# Patient Record
Sex: Male | Born: 1950 | Race: White | Hispanic: No | Marital: Married | Smoking: Former smoker
Health system: Southern US, Community
[De-identification: ages and names within clinical notes are randomized; demographics above are authoritative.]

## PROBLEM LIST (undated history)

## (undated) DIAGNOSIS — F32A Depression, unspecified: Secondary | ICD-10-CM

## (undated) DIAGNOSIS — F329 Major depressive disorder, single episode, unspecified: Secondary | ICD-10-CM

## (undated) DIAGNOSIS — J449 Chronic obstructive pulmonary disease, unspecified: Secondary | ICD-10-CM

## (undated) DIAGNOSIS — E039 Hypothyroidism, unspecified: Secondary | ICD-10-CM

## (undated) DIAGNOSIS — E785 Hyperlipidemia, unspecified: Secondary | ICD-10-CM

## (undated) DIAGNOSIS — F0390 Unspecified dementia without behavioral disturbance: Secondary | ICD-10-CM

## (undated) DIAGNOSIS — F419 Anxiety disorder, unspecified: Secondary | ICD-10-CM

## (undated) DIAGNOSIS — E119 Type 2 diabetes mellitus without complications: Secondary | ICD-10-CM

## (undated) DIAGNOSIS — E7800 Pure hypercholesterolemia, unspecified: Secondary | ICD-10-CM

## (undated) DIAGNOSIS — F319 Bipolar disorder, unspecified: Secondary | ICD-10-CM

## (undated) DIAGNOSIS — I4891 Unspecified atrial fibrillation: Secondary | ICD-10-CM

## (undated) HISTORY — PX: APPENDECTOMY: SHX54

---

## 2007-10-24 ENCOUNTER — Other Ambulatory Visit: Payer: Self-pay | Admitting: Emergency Medicine

## 2007-10-24 ENCOUNTER — Ambulatory Visit: Payer: Self-pay | Admitting: Psychiatry

## 2007-10-24 ENCOUNTER — Inpatient Hospital Stay (HOSPITAL_COMMUNITY): Admission: AD | Admit: 2007-10-24 | Discharge: 2007-10-31 | Payer: Self-pay | Admitting: Psychiatry

## 2010-02-27 ENCOUNTER — Emergency Department (HOSPITAL_COMMUNITY): Admission: EM | Admit: 2010-02-27 | Discharge: 2010-02-27 | Payer: Self-pay | Admitting: Emergency Medicine

## 2010-07-06 ENCOUNTER — Emergency Department (HOSPITAL_COMMUNITY)
Admission: EM | Admit: 2010-07-06 | Discharge: 2010-07-07 | Disposition: A | Discharge status: Other Acute Inpatient Hospital | Payer: Self-pay | Source: Home / Self Care | Admitting: Emergency Medicine

## 2010-07-06 LAB — HEPATIC FUNCTION PANEL
ALT: 56 U/L — ABNORMAL HIGH (ref 0–53)
AST: 42 U/L — ABNORMAL HIGH (ref 0–37)
Albumin: 4.1 g/dL (ref 3.5–5.2)
Alkaline Phosphatase: 72 U/L (ref 39–117)
Bilirubin, Direct: 0.3 mg/dL (ref 0.0–0.3)
Indirect Bilirubin: 1.1 mg/dL — ABNORMAL HIGH (ref 0.3–0.9)
Total Bilirubin: 1.4 mg/dL — ABNORMAL HIGH (ref 0.3–1.2)
Total Protein: 7.3 g/dL (ref 6.0–8.3)

## 2010-07-06 LAB — RAPID URINE DRUG SCREEN, HOSP PERFORMED
Amphetamines: NOT DETECTED
Barbiturates: NOT DETECTED
Benzodiazepines: POSITIVE — AB
Cocaine: NOT DETECTED
Opiates: NOT DETECTED
Tetrahydrocannabinol: NOT DETECTED

## 2010-07-06 LAB — DIFFERENTIAL
Basophils Absolute: 0 10*3/uL (ref 0.0–0.1)
Basophils Relative: 0 % (ref 0–1)
Eosinophils Absolute: 0 10*3/uL (ref 0.0–0.7)
Eosinophils Relative: 0 % (ref 0–5)
Lymphocytes Relative: 18 % (ref 12–46)
Lymphs Abs: 1.9 10*3/uL (ref 0.7–4.0)
Monocytes Absolute: 0.7 10*3/uL (ref 0.1–1.0)
Monocytes Relative: 7 % (ref 3–12)
Neutro Abs: 7.6 10*3/uL (ref 1.7–7.7)
Neutrophils Relative %: 74 % (ref 43–77)

## 2010-07-06 LAB — CBC
HCT: 47.4 % (ref 39.0–52.0)
Hemoglobin: 16.3 g/dL (ref 13.0–17.0)
MCH: 30.9 pg (ref 26.0–34.0)
MCHC: 34.4 g/dL (ref 30.0–36.0)
MCV: 89.8 fL (ref 78.0–100.0)
Platelets: 196 10*3/uL (ref 150–400)
RBC: 5.28 MIL/uL (ref 4.22–5.81)
RDW: 14.6 % (ref 11.5–15.5)
WBC: 10.3 10*3/uL (ref 4.0–10.5)

## 2010-07-06 LAB — GLUCOSE, CAPILLARY
Glucose-Capillary: 136 mg/dL — ABNORMAL HIGH (ref 70–99)
Glucose-Capillary: 140 mg/dL — ABNORMAL HIGH (ref 70–99)

## 2010-07-06 LAB — ACETAMINOPHEN LEVEL: Acetaminophen (Tylenol), Serum: <10 ug/mL — ABNORMAL LOW (ref 10–30)

## 2010-07-06 LAB — BASIC METABOLIC PANEL
BUN: 23 mg/dL (ref 6–23)
CO2: 20 mEq/L (ref 19–32)
Calcium: 9.5 mg/dL (ref 8.4–10.5)
Chloride: 106 mEq/L (ref 96–112)
Creatinine, Ser: 0.86 mg/dL (ref 0.4–1.5)
GFR calc Af Amer: >60 mL/min (ref >60)
GFR calc non Af Amer: >60 mL/min (ref >60)
Glucose, Bld: 186 mg/dL — ABNORMAL HIGH (ref 70–99)
Potassium: 4 mEq/L (ref 3.5–5.1)
Sodium: 137 mEq/L (ref 135–145)

## 2010-07-06 LAB — SALICYLATE LEVEL: Salicylate Lvl: <4 mg/dL (ref 2.8–20.0)

## 2010-07-06 LAB — ETHANOL: Alcohol, Ethyl (B): <5 mg/dL (ref 0–10)

## 2010-07-07 ENCOUNTER — Inpatient Hospital Stay (HOSPITAL_COMMUNITY)
Admission: AD | Admit: 2010-07-07 | Discharge: 2010-07-19 | Payer: Self-pay | Source: Home / Self Care | Attending: Psychiatry | Admitting: Psychiatry

## 2010-07-07 DIAGNOSIS — F331 Major depressive disorder, recurrent, moderate: Secondary | ICD-10-CM

## 2010-07-07 LAB — GLUCOSE, CAPILLARY
Glucose-Capillary: 122 mg/dL — ABNORMAL HIGH (ref 70–99)
Glucose-Capillary: 77 mg/dL (ref 70–99)

## 2010-07-17 LAB — GLUCOSE, CAPILLARY
Glucose-Capillary: 126 mg/dL — ABNORMAL HIGH (ref 70–99)
Glucose-Capillary: 129 mg/dL — ABNORMAL HIGH (ref 70–99)
Glucose-Capillary: 153 mg/dL — ABNORMAL HIGH (ref 70–99)
Glucose-Capillary: 104 mg/dL — ABNORMAL HIGH (ref 70–99)
Glucose-Capillary: 118 mg/dL — ABNORMAL HIGH (ref 70–99)
Glucose-Capillary: 104 mg/dL — ABNORMAL HIGH (ref 70–99)
Glucose-Capillary: 131 mg/dL — ABNORMAL HIGH (ref 70–99)
Glucose-Capillary: 138 mg/dL — ABNORMAL HIGH (ref 70–99)
Glucose-Capillary: 121 mg/dL — ABNORMAL HIGH (ref 70–99)
Glucose-Capillary: 143 mg/dL — ABNORMAL HIGH (ref 70–99)
Glucose-Capillary: 104 mg/dL — ABNORMAL HIGH (ref 70–99)
Glucose-Capillary: 110 mg/dL — ABNORMAL HIGH (ref 70–99)
Glucose-Capillary: 134 mg/dL — ABNORMAL HIGH (ref 70–99)
Glucose-Capillary: 192 mg/dL — ABNORMAL HIGH (ref 70–99)
Glucose-Capillary: 111 mg/dL — ABNORMAL HIGH (ref 70–99)
Glucose-Capillary: 135 mg/dL — ABNORMAL HIGH (ref 70–99)
Glucose-Capillary: 120 mg/dL — ABNORMAL HIGH (ref 70–99)
Glucose-Capillary: 117 mg/dL — ABNORMAL HIGH (ref 70–99)
Glucose-Capillary: 111 mg/dL — ABNORMAL HIGH (ref 70–99)
Glucose-Capillary: 105 mg/dL — ABNORMAL HIGH (ref 70–99)
Glucose-Capillary: 69 mg/dL — ABNORMAL LOW (ref 70–99)
Glucose-Capillary: 104 mg/dL — ABNORMAL HIGH (ref 70–99)
Glucose-Capillary: 112 mg/dL — ABNORMAL HIGH (ref 70–99)
Glucose-Capillary: 109 mg/dL — ABNORMAL HIGH (ref 70–99)
Glucose-Capillary: 112 mg/dL — ABNORMAL HIGH (ref 70–99)
Glucose-Capillary: 118 mg/dL — ABNORMAL HIGH (ref 70–99)
Glucose-Capillary: 126 mg/dL — ABNORMAL HIGH (ref 70–99)

## 2010-07-19 LAB — GLUCOSE, CAPILLARY
Glucose-Capillary: 113 mg/dL — ABNORMAL HIGH (ref 70–99)
Glucose-Capillary: 100 mg/dL — ABNORMAL HIGH (ref 70–99)
Glucose-Capillary: 122 mg/dL — ABNORMAL HIGH (ref 70–99)
Glucose-Capillary: 94 mg/dL (ref 70–99)
Glucose-Capillary: 115 mg/dL — ABNORMAL HIGH (ref 70–99)
Glucose-Capillary: 98 mg/dL (ref 70–99)
Glucose-Capillary: 113 mg/dL — ABNORMAL HIGH (ref 70–99)
Glucose-Capillary: 144 mg/dL — ABNORMAL HIGH (ref 70–99)

## 2010-07-24 LAB — GLUCOSE, CAPILLARY
Glucose-Capillary: 109 mg/dL — ABNORMAL HIGH (ref 70–99)
Glucose-Capillary: 89 mg/dL (ref 70–99)

## 2010-07-31 NOTE — Discharge Summary (Signed)
[name], [name]             ACCOUNT NO.:  [id number]  MEDICAL RECORD NO.:  [id number]          PATIENT TYPE:  IPS  LOCATION:  0301                          FACILITY:  BH  PHYSICIAN:  Eduardo Ditch, MD DATE OF BIRTH:  02-Nov-1950  DATE OF ADMISSION:  07/07/2010 DATE OF DISCHARGE:  07/19/2010                              DISCHARGE SUMMARY   IDENTIFYING INFORMATION:  This is a 60 year old Caucasian male, married. This is a voluntary admission.  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 He presented after his wife's caregiver called emergency services because of mental status changes.  He appeared confused and just not himself.  The caretaker took out an involuntary commitment for his safety.  He had a history of previous admissions at [name] in 2009 under similar circumstances.  In assessment he had made some suicidal comments but then when asked directly if he was suicidal he denied this.  He does have a previous history of holding a knife to his throat at one time in the past.  He is currently followed as an outpatient as [name] and reports that he had been taking his medications regularly.  MEDICAL EVALUATION AND DIAGNOSTIC STUDIES:  He was medically evaluated in the [name] emergency room where a full physical exam is noted. This is a large built Caucasian male, normally developed with normal vital signs.  CHRONIC MEDICAL PROBLEMS:  Include diabetes, hypertension and dyslipidemia.  His primary care physician is Dr. Elizabeth [facility] at the [name].  DIAGNOSTIC STUDIES:  Revealed normal CBC.  Urine drug screen positive for benzodiazepines and metabolic profile with mild elevation in liver enzymes with SGOT 42, SGPT 56, total bilirubin 1.4 and his random glucose was 186.  Admitting mental status exam revealed an alert and oriented  male, disheveled with poor hygiene and dirty clothing.  Specifically denied being suicidal or homicidal.  Affect rather perplexed.  Mood depressed and anxious.  He reported no memory for the behaviors that led to his coming to the [facility].  Did express concern about how his wife was doing at home since she had multiple sclerosis.  No evidence of internal distractions.  ADMITTING DIAGNOSES:  AXIS I:  Rule out major depressive disorder with psychotic features. AXIS II:  Deferred. AXIS III:  Hypertension, hypercholesterolemia, type 2 diabetes and obesity. AXIS IV:  Deferred. AXIS V:  Current 40, past year not known.  COURSE OF HOSPITALIZATION:  He was admitted to our mood disorders clinic.  We had no evidence that he had any current or past history of substance abuse.  We continued his routine medications that included Ambien 10 mg at bedtime, Zoloft 100 mg 1-1/2 tabs daily, bupropion SR 1 tab daily, alprazolam 0.25 mg 1 tab daily and sertraline 100 mg 1-1/2 tablets daily.  Medications were validated with the [name].  We elected to add Abilify 2 mg p.o. q.h.s.  His CBGs were monitored closely while on the unit and he received regular evaluations per our diabetes consult nurse.  He  did not require a change in his diabetes medications.  On initial admission he did appear to be confused with some memory impairment and was complaining that his wife was dying and in fact was able to give a day and date on which she was "scheduled" to die. However, over the course of the next 48 hours his mentation cleared and he was able to articulate that his wife is chronically ill with multiple sclerosis but is not terminally ill and functions well at home with the help of a caregiver who comes daily to assist her.  He also reported struggling with decisions about retirement and initially expressed a lot of paranoia, believing that his boss was against him and wanting to fire him,  also that his boss had placed two monitoring devices in the roof of his house, the ceiling of his house and in his car.  These delusions resolved over the course of the next 72 hours as his insight improved with more sleep and regular medication.  He was still somewhat mildly guarded on January 9, believing that his boss was out to get him. Meanwhile he had been in touch with one of his close friends who was reassuring him that none of these things were true and that his mind was playing tricks on him.  By January 10 we increased his Abilify to 5 mg q.h.s. to help with thought blocking and some continued guarding and paranoia.  We also increased his Zoloft to 200 mg daily.  He was denying any active suicidal thoughts.  He gave permission for [place] to contact his friend who confirmed that the patient had been talking about the end of life for himself but believes that the patient did not have any access to weapons.  By January 13 his affect was much broader, much more relaxed.  Insight significantly improved completely understanding that his supervisor was not after him. No more paranoia, recognized that his thinking was not correct and at a loss to explain what might have happened to him.  Denying any suicidal thoughts at that point.  Meanwhile we continued to work with the medications and increased his Abilify to 7.5 mg at bedtime and increased the Wellbutrin to 150 mg daily.  By January 17 his hygiene was fully improved.  He was well dressed, clean, clean shaven with a sense of humor.  By January 18 he was in full contact with reality and more than 48 hours with no dangerous thoughts.  Insight good, being able to articulate some of his stressors.  We discussed with him and arranged for him to receive 2 weeks off of work after he goes home to settle paperwork and make decisions about his future at work.  His wife was arranging nursing home placement for herself and he expressed his  relief over this.  DISCHARGE MENTAL STATUS EXAM:  Fully alert male, pleasant, cooperative, in no physical distress.  Clearly and convincingly denying any suicidal thoughts, bright affect, fully participating in all group and unit activities.  Blood sugars are stable.  He has plans to pursue his retirement options and evaluate those and has support of his friend.  DISCHARGE DIAGNOSES:  AXIS I:  Major depression recurrent severe with psychotic features. AXIS II:  No diagnosis. AXIS III:  Diabetes mellitus type 2, hypertension, dyslipidemia. AXIS IV:  Stage of life stressors.  Concern with spousal illness and caregiver stressors. AXIS V:  Current 62, past year not known.  DISCHARGE CONDITION:  Stable.  DISCHARGE  PLAN:  [facility] at 11:00 a.m.  DISCHARGE MEDICATIONS: 1. Aripiprazole 7.5 mg daily at h.s. 2. Bupropion hydrochloride SR 150 mg q.a.m. 3. Lisinopril 5 mg daily. 4. Sertraline 200 mg daily. 5. Trazodone 150 mg q.h.s. 6. Ambien 10 mg q.h.s. 7. Aspirin 81 mg daily. 8. Glipizide 5 mg 1/2 tablet every morning. 9. Metformin 500 mg 1/2 tablet in the a.m., 1 tablet at h.s. 10.Multivitamins one daily. 11.Simvastatin 20 mg daily at h.s. 12.He was instructed to stop taking alprazolam.     [name], N.P.   ______________________________ Eduardo Ditch, MD    MAS/MEDQ  D:  07/21/2010  T:  07/21/2010  Job:  [id number]  Electronically Signed by Kari Baars N.P. on 07/31/2010 08:36:47 AM Electronically Signed by [name]  on 07/31/2010 06:22:39 PM

## 2010-08-31 NOTE — H&P (Signed)
[name], [name]             ACCOUNT NO.:  [id number]  MEDICAL RECORD NO.:  [id number]          PATIENT TYPE:  IPS  LOCATION:  0301                          FACILITY:  BH  PHYSICIAN:  Vic Ripper, P.A.-C.DATE OF BIRTH:  October 04, 1950  DATE OF ADMISSION:  07/07/2010 DATE OF DISCHARGE:                      PSYCHIATRIC ADMISSION ASSESSMENT   INVOLUNTARY ADMISSION  This is a 60 year old married white male.  The commitment papers indicate that he keeps telling his wife that they will not be here much longer.  He appears to be having a mental breakdown.  He states that he hears angels and demons.  He talks to himself and his bed ridden wife. It was felt that he had become a danger to himself and others and.  His wife's caretaker actually took out the involuntary commitment papers. He was here with [place] once before back in 2009, April 24 to Oct 31, 2007, under similar circumstances.  His wife does have MS, she is bed-bound. She has a 2/3 time home health aide.  The health aide was worried that if the patient remained in the home he would harm them both.  The police were called.  He was found wandering the streets and when they approached him, he went limp. He refused help.  He refused to get into the [facility] car at first.  Back in 2009, he attempted suicide.  He held a knife to his throat at that time.  PAST PSYCHIATRIC HISTORY:  As already stated, he was here at the [facility] April 24 the patient Oct 31, 2007. He is followed as an outpatient in [place] at the [facility].  He has both primary care and psychiatric follow-up there.  SOCIAL HISTORY:  He reports that he finished high school.  He also attended junior college.  He has been married just once, there are no children.  He remains employed at the [employer].  He has been employed there since 1988.  He works at the [employer].  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  FAMILY HISTORY:  He is adopted.  PRIMARY CARE PROVIDER:  Thresa Ross PA up at [facility] outpatient clinic and then he also apparently is under the care of Dr. Elizabeth Sauer.  MEDICAL PROBLEMS:  He is known to have hypertension, diabetes, hypercholesteremia.  He is obese.  MEDICATIONS:  His med list from the [place] indicates that he is currently prescribed alprazolam 0.25 mg take 1 tablet p.o. daily p.r.n. anxiety, aspirin 81 mg EC type, take on p.o. daily to help prevent heart attack and stroke, bupropion 100 mg SA take one in the morning for depression, glipizide 5 mg take 1/2 tablet by mouth every morning for diabetes, lisinopril 5 mg take 1/2 tablet by mouth daily for blood pressure, metformin HCl 500 mg take 1/2 tablet in the morning and whole tablet every evening.  Multivitamins and minerals therapy tab take one p.o. daily, sertraline 100 mg take 1-1/2 tablets by mouth daily, simvastatin 40 mg take 20 mg at bedtime for cholesterol and zolpidem 10 mg at bedtime p.r.n. sleep.  DRUG ALLERGIES:  He has no known drug allergies.  PHYSICAL EXAMINATION:  He was medically cleared in the ED at [facility].  His vital signs showed him to be afebrile.  He was 97.7 to, 98.9. His pulse varied from 64-91, respirations are 16-20 and blood pressure is 94/66 to 147/86.  LABORATORY DATA:  CBC had no abnormalities.  His UDS was positive for benzodiazepines and he is prescribed alprazolam.  His metabolic profile showed elevations of SGOT at 42, SGPT at 56.  His total bilirubin was also elevated at 1.4 as was direct bilirubin at 1.1.  Glucose was elevated at 186 and he had no measurable alcohol.  MENTAL STATUS EXAM:  Today he is alert and oriented.  He and is somewhat unkempt.  He needs to shave.  He is in hospital gown.  His speech is not pressured.  His mood is depressed and anxious.  His thought processes are clearing.  He specifically denies being suicidal or  homicidal.  He denies having any auditory or visual hallucinations.  This is consistent with when he was seen by Dr. Rogers Blocker yesterday he also denied all of these at the time and questions how he came to be here.  He reports no memory for his behaviors that led to coming here.  His thought processes are clear, rational and goal oriented.  He is anticipating that he be transferred to the [facility], although he currently denies being suicidal, homicidal or having any auditory or visual hallucinations.  ADMITTING DIAGNOSES:  Axis I:  Major depressive disorder with psychotic features. Axis II:  Deferred. Axis III:  Hypertension, hypercholesteremia, type 2 diabetes, obesity. Axis IV:  Unclear. Axis V:  40.  PLAN:  Admit for safety and stabilization.  His medications will be adjusted and maximized.  Toward that Abilify 2 mg at bedtime was started and we will continue [facility] be [facility] if [facility].  Estimated length of stay is 3-5 days.     Vic Ripper, P.A.-C.     MD/MEDQ  D:  07/08/2010  T:  07/08/2010  Job:  [id number]  Electronically Signed by Jaci Lazier ADAMS P.A.-C. on 07/15/2010 12:38:18 PM Electronically Signed by Eulogio Ditch  on 08/31/2010 04:46:14 PM

## 2010-11-14 NOTE — H&P (Signed)
NAMETEVEN, MITTMAN             ACCOUNT NO.:  [id number]   MEDICAL RECORD NO.:  [id number]          PATIENT TYPE:  IPS   LOCATION:  0500                          FACILITY:  BH   PHYSICIAN:  Geoffery Lyons, M.D.      DATE OF BIRTH:  1951-04-30   DATE OF ADMISSION:  10/24/2007  DATE OF DISCHARGE:                       PSYCHIATRIC ADMISSION ASSESSMENT   This is an involuntary admission to the services of Dr. Geoffery Lyons.   IDENTIFYING INFORMATION:  This is a 60 year old married white male.  He  is also a Statistician.  He is not sure of his percentage,  but his service connection is both medical and psychiatric.  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  [facility] and stated that  the next thing he knew they had taken him to [facility] for psychiatric assessment.  His commitment papers indicate that  he called the [facility] reporting that he was suicidal.  He reported that he  was holding a knife to his throat.  The police were called and they  picked him up and brought him to [facility].  At that  point, he changed his story to say that he had actually done these  things back on Tuesday.  He was speaking very slowly, repeating himself.  He was obviously depressed, and they were concerned that he was  potentially dangerous to himself.  He was speaking so slowly they were  concerned that he had actually overdosed on Friday.  He was taken to the  emergency department at [facility].  His UDS was positive for  benzodiazepines; however, he is prescribed Xanax, and his glucose was  elevated at 125.  He had no other remarkable physical findings, and he  was admitted to the [facility].  He did ask to be  transferred to the [facility] hospital.  He is very concerned about finances.  His  wife has MS.  Apparently her hospital expenses, etc., are getting  quite out of hand, and it has been recommended that in order for him to  maintain any of his benefits and retirement that they go on and get  divorced.  Apparently his wife will not hear of this, and the patient  himself is overwhelmed as he feels that he has done everything right all  his life and does not see that both of them should drown.  Adding to  his situation, the other day he was shorting the market, he day trades,  and he lost $38,000.   PAST PSYCHIATRIC HISTORY:  He states he has been a patient at the [place]  since 1979 and has been prescribed psychiatric medications since that  time.   SOCIAL HISTORY:  He has some college.  He has been married once.  They  have no children.  His wife has MS and she was admitted to the hospital  last week when he became  overwhelmed.   FAMILY HISTORY:  The patient is adopted.   ALCOHOL AND DRUG HISTORY:  He denies.  He has been sober for 23 years.   PRIMARY CARE Irineo Gaulin:  He is seen at the [facility]  clinic, and he is followed psychiatrically by Dr. Guss Bunde.   MEDICAL PROBLEMS:  He has hypertension.   MEDICATIONS:  He is currently prescribed mirtazapine 30 mg at h.s.,  citalopram 40 mg p.o. daily, verapamil HCl 180 mg daily, and alprazolam  0.25 mg b.i.d. p.r.n.   DRUG ALLERGIES:  He has no known drug allergies.   POSITIVE PHYSICAL FINDINGS:  As already stated, he was medically cleared  in the ED at [facility]. His glucose was slightly elevated, and he was  positive for benzos.  His vital signs on admission showed that he is 274  pounds, his temperature was 97.8, blood pressure was 157/99 to 151/96,  pulse 85 to 92, respirations 16.   MENTAL STATUS EXAM:  Today he is alert and oriented.  He is  appropriately groomed, dressed and nourished.  His speech is still a  little bit slow.  His mood is depressed.  His affect is congruent.  His  thought  processes are clear, rational and goal oriented.  He has a plan  to go on and get a divorce to protect his assets after his wife succumbs  to MS.  Judgment and insight are good.  Concentration and memory are  intact.  Intelligence is at least average.  He denies being actively  suicidal or homicidal.  He denies auditory or visual hallucinations.  He  does acknowledge having tinnitus, however.   DIAGNOSIS:  AXIS I:  Major depressive disorder, severe,without psychotic  features.  AXIS II:  Deferred.  AXIS III:  Hypertension.  Caregiver stress.  Obesity.  AXIS IV:  Primary support issues and economic issues.  AXIS V:  35.   PLAN:  The plan is to admit for safety and stabilization.  We will  adjust his medications.  Toward that end, we will add some doxepin to  help with his depression and anxiety, and we will schedule a family  session with his wife.  Estimated length of stay is 3 to 5 days.      Mickie Leonarda Salon, P.A.-C.      Geoffery Lyons, M.D.  Electronically Signed    MD/MEDQ  D:  10/25/2007  T:  10/25/2007  Job:  [id number]

## 2010-11-17 NOTE — Discharge Summary (Signed)
[name], [name]             ACCOUNT NO.:  [id number]   MEDICAL RECORD NO.:  [id number]          PATIENT TYPE:  IPS   LOCATION:  0500                          FACILITY:  BH   PHYSICIAN:  Geoffery Lyons, M.D.      DATE OF BIRTH:  [birth year]   DATE OF ADMISSION:  10/24/2007  DATE OF DISCHARGE:  10/31/2007                               DISCHARGE SUMMARY   CHIEF COMPLAINT AND PRESENT ILLNESS:  This was the first admission to  [facility] for this 60 year old married white male,  service connected.  He endorsed that he took a handful of pills.  He was  overwhelmed.  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  Then he  called the [place].  He claimed that the next thing he knew he was taken to  the [facility].  He called the [place], apparently  reporting suicidal thoughts and that he had been holding a knife to his  throat.  Police picked him up.  He endorsed being very overwhelmed.  His  wife has MS and hospital expenses are getting out of hand.   PAST PSYCHIATRIC HISTORY:  He had been a patient in the [place] since 1979.   ALCOHOL AND DRUG HISTORY:  He denies active use of any substances.   MEDICAL HISTORY:  Hypertension.   MEDICATION:  1. Remeron 30 mg at night.  2. Celexa 40 mg per day.  3. Verapamil 180 mg per day.  4. Alprazolam 0.25 twice a day as needed.   PHYSICAL EXAMINATION:  Physical exam failed to show any acute findings.   LABORATORY WORKUP:  UDS positive for benzodiazepines.   MENTAL STATUS EXAMINATION:  Exam reveals an alert cooperative male,  appropriately groomed and dressed, some of psychomotor retardation.  Mood is depressed.  Affect depressed.  Thought process is logical,  coherent and relevant.  Overwhelmed, endorsed that he was advised to get  a divorce to protect his assets after his wife succumbs to MS. No  delusions.  No active suicidal or homicidal ideas.  No hallucinations.  Cognition well preserved.   ADMITTING  DIAGNOSES:   AXIS I:  Major depressive disorder.   AXIS II:  No diagnosis.   AXIS III:  Hypertension.   AXIS IV:  Moderate.   AXIS V:  Upon admission 35; highest global assessment of functioning in  the last year was 70.   COURSE IN THE HOSPITAL:  He was admitted, started in individual and  group psychotherapy. He was maintained on his medications.  Remeron was  increased.  He was given some Seroquel.  Initially, he was somewhat  reserved and somewhat guarded, eventually able to open up.  He endorsed  that he got very upset and got very overwhelmed with the fact that his  life has become taking care of his wife who has MS, and had been dealing  with the wife's MS for most of their married life.  He got upset and  took an overdose, put a knife to his neck.  He also lost a lot of  money  in [place], which compounded the situation.  April 28, he was still  dealing with the situation at home, working on coping, admitted that he  gets overwhelmed and he forgets to take care of himself. He felt  depressed, but as the hospitalization progressed, he slept better.  He  felt more rested.  He work on Counsellor.  By  April 29, he was starting to feel better.  He endorsed that he could not  go back to the same situation, that some things needed to change and he  was willing to do that.  We continued to adjust the medications.  He was  still a little anxious about the transition to go back home, but felt  stronger.  He was willing to come to the intensive outpatient program to  continue to work on coping as he returned home.  Upon discharge, in full  contact reality, no suicidal or homicidal ideas, no hallucinations or  delusions.  He endorsed he was feeling better than he when he was  admitted, having learned a lot of coping skills and ways to take care of  himself.   DISCHARGE DIAGNOSES:   AXIS I:  Major depressive disorder.   AXIS II:  No diagnosis.   AXIS  III:  Hypertension.   AXIS IV:  Moderate.   AXIS V:  Upon discharge 60.   DISCHARGE MEDICATIONS:  1. Celexa 40 mg twice a day.  2. Remeron SolTab 45 mg at night.  3. Verapamil 180 mg per day.  4. Xanax 0.25 mg up to twice a day as needed for anxiety.   FOLLOWUP:  Follow up at [facility] in [place] as well as [facility].      Geoffery Lyons, M.D.  Electronically Signed     IL/MEDQ  D:  12/04/2007  T:  12/04/2007  Job:  [id number]

## 2010-11-18 ENCOUNTER — Emergency Department (HOSPITAL_BASED_OUTPATIENT_CLINIC_OR_DEPARTMENT_OTHER)
Admission: EM | Admit: 2010-11-18 | Discharge: 2010-11-18 | Disposition: A | Discharge status: Home / Self Care | Payer: Federal, State, Local not specified - PPO | Attending: Emergency Medicine | Admitting: Emergency Medicine

## 2010-11-18 DIAGNOSIS — E7800 Pure hypercholesterolemia, unspecified: Secondary | ICD-10-CM | POA: Insufficient documentation

## 2010-11-18 DIAGNOSIS — I10 Essential (primary) hypertension: Secondary | ICD-10-CM | POA: Insufficient documentation

## 2010-11-18 DIAGNOSIS — F341 Dysthymic disorder: Secondary | ICD-10-CM | POA: Insufficient documentation

## 2010-11-18 DIAGNOSIS — E1169 Type 2 diabetes mellitus with other specified complication: Secondary | ICD-10-CM | POA: Insufficient documentation

## 2010-11-18 LAB — CBC
MCH: 29.7 pg (ref 26.0–34.0)
Platelets: ADEQUATE 10*3/uL (ref 150–400)
RBC: 5.42 MIL/uL (ref 4.22–5.81)
RDW: 14.3 % (ref 11.5–15.5)
WBC: 9.9 10*3/uL (ref 4.0–10.5)

## 2010-11-18 LAB — URINE MICROSCOPIC-ADD ON

## 2010-11-18 LAB — DIFFERENTIAL
Basophils Relative: 0 % (ref 0–1)
Eosinophils Absolute: 0.3 10*3/uL (ref 0.0–0.7)
Eosinophils Relative: 3 % (ref 0–5)
Monocytes Relative: 6 % (ref 3–12)
Neutrophils Relative %: 64 % (ref 43–77)

## 2010-11-18 LAB — BASIC METABOLIC PANEL
BUN: 21 mg/dL (ref 6–23)
Chloride: 96 mEq/L (ref 96–112)
Creatinine, Ser: 0.6 mg/dL (ref 0.4–1.5)
GFR calc Af Amer: >60 mL/min (ref >60)
GFR calc non Af Amer: >60 mL/min (ref >60)

## 2010-11-18 LAB — GLUCOSE, CAPILLARY
Glucose-Capillary: 423 mg/dL — ABNORMAL HIGH (ref 70–99)
Glucose-Capillary: 265 mg/dL — ABNORMAL HIGH (ref 70–99)

## 2010-11-18 LAB — URINALYSIS, ROUTINE W REFLEX MICROSCOPIC
Bilirubin Urine: NEGATIVE
Hgb urine dipstick: NEGATIVE
Specific Gravity, Urine: 1.043 — ABNORMAL HIGH (ref 1.005–1.030)
pH: 6 (ref 5.0–8.0)

## 2011-03-27 LAB — COMPREHENSIVE METABOLIC PANEL
ALT: 50
AST: 36
Alkaline Phosphatase: 73
CO2: 24
Chloride: 106
GFR calc Af Amer: >60
GFR calc non Af Amer: >60
Glucose, Bld: 125 — ABNORMAL HIGH
Potassium: 3.9
Sodium: 138
Total Bilirubin: 1.1

## 2011-03-27 LAB — CBC
Hemoglobin: 15.5
MCHC: 34.7
RBC: 4.93
WBC: 10.2

## 2011-03-27 LAB — SALICYLATE LEVEL: Salicylate Lvl: <4

## 2011-03-27 LAB — DIFFERENTIAL
Basophils Relative: 0
Eosinophils Absolute: 0.2
Eosinophils Relative: 2
Lymphs Abs: 3.7
Neutrophils Relative %: 56

## 2011-03-27 LAB — ETHANOL: Alcohol, Ethyl (B): <5

## 2011-03-27 LAB — RAPID URINE DRUG SCREEN, HOSP PERFORMED
Barbiturates: NOT DETECTED
Benzodiazepines: POSITIVE — AB

## 2011-03-27 LAB — TRICYCLICS SCREEN, URINE: TCA Scrn: NOT DETECTED

## 2011-03-27 LAB — ACETAMINOPHEN LEVEL: Acetaminophen (Tylenol), Serum: <10 — ABNORMAL LOW

## 2011-04-30 ENCOUNTER — Encounter: Payer: Self-pay | Admitting: *Deleted

## 2011-04-30 ENCOUNTER — Emergency Department (HOSPITAL_BASED_OUTPATIENT_CLINIC_OR_DEPARTMENT_OTHER)
Admission: EM | Admit: 2011-04-30 | Discharge: 2011-05-01 | Disposition: A | Discharge status: Home / Self Care | Payer: Federal, State, Local not specified - PPO | Attending: Emergency Medicine | Admitting: Emergency Medicine

## 2011-04-30 DIAGNOSIS — R45851 Suicidal ideations: Secondary | ICD-10-CM | POA: Insufficient documentation

## 2011-04-30 DIAGNOSIS — E7800 Pure hypercholesterolemia, unspecified: Secondary | ICD-10-CM | POA: Insufficient documentation

## 2011-04-30 DIAGNOSIS — F341 Dysthymic disorder: Secondary | ICD-10-CM | POA: Insufficient documentation

## 2011-04-30 DIAGNOSIS — Z79899 Other long term (current) drug therapy: Secondary | ICD-10-CM | POA: Insufficient documentation

## 2011-04-30 DIAGNOSIS — E119 Type 2 diabetes mellitus without complications: Secondary | ICD-10-CM | POA: Insufficient documentation

## 2011-04-30 HISTORY — DX: Major depressive disorder, single episode, unspecified: F32.9

## 2011-04-30 HISTORY — DX: Depression, unspecified: F32.A

## 2011-04-30 HISTORY — DX: Anxiety disorder, unspecified: F41.9

## 2011-04-30 HISTORY — DX: Pure hypercholesterolemia, unspecified: E78.00

## 2011-04-30 LAB — COMPREHENSIVE METABOLIC PANEL
AST: 27 U/L (ref 0–37)
Albumin: 4.3 g/dL (ref 3.5–5.2)
BUN: 19 mg/dL (ref 6–23)
Chloride: 103 mEq/L (ref 96–112)
Creatinine, Ser: 0.7 mg/dL (ref 0.50–1.35)
Potassium: 4.1 mEq/L (ref 3.5–5.1)
Total Protein: 7.4 g/dL (ref 6.0–8.3)

## 2011-04-30 LAB — RAPID URINE DRUG SCREEN, HOSP PERFORMED
Amphetamines: NOT DETECTED
Benzodiazepines: NOT DETECTED
Cocaine: NOT DETECTED

## 2011-04-30 LAB — ACETAMINOPHEN LEVEL: Acetaminophen (Tylenol), Serum: <15 ug/mL (ref 10–30)

## 2011-04-30 LAB — CBC
HCT: 43.2 % (ref 39.0–52.0)
MCHC: 34.3 g/dL (ref 30.0–36.0)
MCV: 88.9 fL (ref 78.0–100.0)
Platelets: 254 10*3/uL (ref 150–400)
RDW: 14.8 % (ref 11.5–15.5)
WBC: 12.8 10*3/uL — ABNORMAL HIGH (ref 4.0–10.5)

## 2011-04-30 LAB — ETHANOL: Alcohol, Ethyl (B): <11 mg/dL (ref 0–11)

## 2011-04-30 NOTE — ED Notes (Signed)
Called [facility] and spoke with Geraldo Docker from [facility] called and said he will be here in .

## 2011-04-30 NOTE — ED Notes (Signed)
Patient undressed into blue paper scrubs. Patient calm and pleasant at this time. Verbalizes a "desperate need" for "mental health help". Patients caregiver that accompanied him to this facility has patients watch and wallet and states she is going to take them back to where the patient lives. Patients belongings bagged include a pair of black sneakers, a pair of green shorts, a pair of black and grey sweatpants, a white undershirt, and a navy/white striped collared shirt. Patient has all belongings in one bag that has been labeled with appropriate patient label, and has been placed at charge RN desk.

## 2011-04-30 NOTE — ED Provider Notes (Addendum)
History     CSN: [id number] Arrival date & time: 04/30/2011  8:53 PM   First MD Initiated Contact with Patient 04/30/11 2112      Chief Complaint  Patient presents with  . Medical Clearance    (Consider location/radiation/quality/duration/timing/severity/associated sxs/prior treatment) HPI History provided by patient and his caregiver.  Pt lives at home w/ his wife who has MS.  Pt has early dementia.  Today he began to feel overwhelmed with life.  He is anxious but denies depression.   No known h/o anxiety, depression, bipolar, schizophrenia or any other psychiatric illnesses.  Sees a psychiatrist but unsure of reason.  Denies SI/HI, hallucinations, drug/alcohol abuse.  Patient's caregiver reports that pt has been stating that he "doesn't want to live anymore" over the last few days.  He has a gun in his truck that he bought a few weeks ago. He was recently started on Thiothixene.  Per prior chart, pt admitted to behavioral health 07/2010 for depression and SI.   Past Medical History  Diagnosis Date  . Diabetes mellitus   . High cholesterol   . Depression   . Anxiety     Past Surgical History  Procedure Date  . Appendectomy     No family history on file.  History  Substance Use Topics  . Smoking status: Former Games developer  . Smokeless tobacco: Never Used  . Alcohol Use: No     former      Review of Systems  All other systems reviewed and are negative.    Allergies  Review of patient's allergies indicates no known allergies.  Home Medications   Current Outpatient Rx  Name Route Sig Dispense Refill  . GLIPIZIDE 5 MG PO TABS Oral Take 5 mg by mouth daily.      . INSULIN GLARGINE 100 UNIT/ML [place] SOLN Subcutaneous Inject 40 Units into the skin at bedtime.      . L-METHYLFOLATE-B6-B12 3-35-2 MG PO TABS Oral Take 1 tablet by mouth daily.      Marland Kitchen METFORMIN HCL 500 MG PO TABS Oral Take 500 mg by mouth 3 (three) times daily.      Marland Kitchen SIMVASTATIN 40 MG PO TABS Oral Take 40 mg by  mouth at bedtime.      . THIOTHIXENE 5 MG PO CAPS Oral Take 5 mg by mouth at bedtime.        BP 153/87  Pulse 87  Temp(Src) 98.2 F (36.8 C) (Oral)  Ht 5\' 11"  (1.803 m)  Wt 220 lb (99.791 kg)  BMI 30.68 kg/m2  SpO2 96%  Physical Exam  Nursing note and vitals reviewed. Constitutional: He is oriented to person, place, and time. He appears well-developed and well-nourished. No distress.  HENT:  Head: Normocephalic and atraumatic.  Eyes:       Normal appearance  Neck: Normal range of motion.  Neurological: He is alert and oriented to person, place, and time.  Psychiatric: His behavior is normal.       Flat affect    ED Course  Procedures (including critical care time)  Labs Reviewed  CBC - Abnormal; Notable for the following:    WBC 12.8 (*)    All other components within normal limits  COMPREHENSIVE METABOLIC PANEL  ETHANOL  ACETAMINOPHEN LEVEL  URINE RAPID DRUG SCREEN (HOSP PERFORMED)   No results found.   1. Suicidal ideation       MDM  Pt presents for medical clearance.  Has been feeling overwhelmed.  Caregiver reports SI  and pt had a gun in truck.  Medically cleared.  Holding orders written.  Mobile crisis team has consulted.         Otilio Miu, PA 05/01/11 0001  Otilio Miu, PA 05/01/11 [phone]

## 2011-04-30 NOTE — ED Notes (Signed)
[facility] at bedside.

## 2011-04-30 NOTE — ED Notes (Signed)
Patient denies pain and is resting comfortably.  

## 2011-04-30 NOTE — ED Notes (Signed)
[name] is here from [facility] to see the Pt.

## 2011-04-30 NOTE — ED Notes (Signed)
Pt brought in from home by caregiver- pt reports "feeling overwhelmed"- admits to thoughts of self harm at times but not today- states "i would blow my brains out"- caregiver reports she found a gun in his car- pt cooperative at present requesting help

## 2011-05-01 ENCOUNTER — Emergency Department (INDEPENDENT_AMBULATORY_CARE_PROVIDER_SITE_OTHER): Payer: Federal, State, Local not specified - PPO

## 2011-05-01 DIAGNOSIS — Z0389 Encounter for observation for other suspected diseases and conditions ruled out: Secondary | ICD-10-CM

## 2011-05-01 LAB — GLUCOSE, CAPILLARY: Glucose-Capillary: 97 mg/dL (ref 70–99)

## 2011-05-01 MED ORDER — LORAZEPAM 1 MG PO TABS
1.0000 mg | ORAL_TABLET | Freq: Three times a day (TID) | ORAL | Status: DC | PRN
  Administered 2011-05-01 (×2): 1 mg via ORAL
  Filled 2011-05-01: qty 1

## 2011-05-01 MED ORDER — ZOLPIDEM TARTRATE 5 MG PO TABS
5.0000 mg | ORAL_TABLET | Freq: Every evening | ORAL | Status: DC | PRN
  Filled 2011-05-01: qty 1

## 2011-05-01 MED ORDER — IBUPROFEN 400 MG PO TABS: 600.0000 mg | ORAL_TABLET | Freq: Three times a day (TID) | ORAL | Status: DC | PRN

## 2011-05-01 MED ORDER — ACETAMINOPHEN 325 MG PO TABS: 650.0000 mg | ORAL_TABLET | ORAL | Status: DC | PRN

## 2011-05-01 MED ORDER — ONDANSETRON HCL 8 MG PO TABS: 4.0000 mg | ORAL_TABLET | Freq: Three times a day (TID) | ORAL | Status: DC | PRN

## 2011-05-01 NOTE — ED Notes (Signed)
Report received from [facility] [place] and care assumed.  Pt resting quietly with eyes closed.  Respirations easy and unlabored and pt in view of nursing station with door open. Awaiting placement.

## 2011-05-01 NOTE — ED Notes (Signed)
Spoke with Tresa Endo from [employer] and she states she will follow up with a face to face assessment or phone call by 10:30 am.  Awaiting placement at [facility]. Papers faxed to [employer] [facility]oder per [facility] request.  Confirmation faxed went through via unit secretary.  Pt remains asleep.  Respirations easy and unlabored.  Pt in view of nursing station.

## 2011-05-01 NOTE — ED Notes (Signed)
Breakfast provided.

## 2011-05-01 NOTE — ED Notes (Signed)
[facility] counselor at bedside.  

## 2011-05-01 NOTE — ED Notes (Signed)
Pt. Report given to [facility]

## 2011-05-01 NOTE — ED Notes (Signed)
Pt resting quietly in ED room.  No change in assessment.

## 2011-05-01 NOTE — ED Notes (Signed)
Contacted [facility] spoke with Victorino Dike regarding continued need for a sitter for this patient. She states there is no sitter availability until at least 1100 . She states she will try to send one at 1100.  Pt is in paper scrubs and has none of his belongings is in room 12.- psych hold room with door open and camera on to be monitored at desk. Pt now sleeping soundly with even and nonlabored respirations

## 2011-05-01 NOTE — ED Notes (Signed)
Cheese and Water gave to pt per request.

## 2011-05-01 NOTE — ED Provider Notes (Signed)
Pt awaiting placement, not accepted at [facility] will now try [place]   Date: 05/01/2011  Rate: 67  Rhythm: normal sinus rhythm  QRS Axis: normal  Intervals: normal  ST/T Wave abnormalities: nonspecific ST changes  Conduction Disutrbances:none  Narrative Interpretation:   Old EKG Reviewed: changes noted    Joya Gaskins, MD 05/01/11 1125

## 2011-05-01 NOTE — ED Notes (Signed)
ACT member at bedside. States there are a couple facilities that are willing to accept pt. One in [place] and the other in [place]. Requesting that pt be made an involuntary commit prior to transfer. ACT member working on papers at this time.

## 2011-05-01 NOTE — ED Notes (Signed)
Pt has been accepted at [facility]. Arranging transport for patient at this time. Pt requesting anxiety and sleeping medication. Will medicate prior to transfer.

## 2011-05-01 NOTE — ED Notes (Signed)
Spoke with [facility] regarding placement.  Pt was declined at [facility].  Possible placement at [facility] pending medical clearance of additional labs and diagnostics. (EKG, CXR, CBC and CMP). Attending informed.

## 2011-05-01 NOTE — ED Notes (Signed)
Patient denies pain and is resting comfortably.  

## 2011-05-01 NOTE — ED Notes (Signed)
Pt. Was given 2 microwave meals and drink.  No distress noted in Pt.

## 2011-05-01 NOTE — ED Notes (Signed)
Meal provided.  Awaiting placement.  No change in pt assessment.  Pt is sitting on side of bed and sitter remains at bedside.

## 2011-05-01 NOTE — ED Notes (Signed)
Awaiting [facility] to arrive.

## 2011-05-01 NOTE — ED Notes (Signed)
No change in pt assessment.

## 2011-05-01 NOTE — ED Notes (Signed)
Snack provided and meal offered.  Pt informed of plan of care.  Continue to wait for placement.

## 2011-05-01 NOTE — ED Notes (Signed)
[facility] counselor at bedside for face to face assessment.

## 2011-05-01 NOTE — ED Notes (Signed)
Report given to Control and instrumentation engineer at [facility]

## 2011-05-01 NOTE — ED Notes (Signed)
Report given to [facility] to [facility]

## 2011-05-01 NOTE — ED Notes (Signed)
Pt leaving with [facility] at this time

## 2011-05-01 NOTE — ED Notes (Signed)
CS PA found in Pt. Records that the Pt. Did attempt suicide in 2009 but no indepth statement in findings.

## 2011-05-01 NOTE — ED Notes (Signed)
Pt ambulatory to radiology

## 2011-05-01 NOTE — ED Notes (Signed)
Sitter at bedside.  Pt remains calm, cooperative and informed of plan of care.

## 2011-05-01 NOTE — ED Notes (Signed)
Pt informed of plan of care. 

## 2011-05-01 NOTE — ED Provider Notes (Signed)
Medical screening examination/treatment/procedure(s) were performed by non-physician practitioner and as supervising physician I was immediately available for consultation/collaboration.  Shelda Jakes, MD 05/01/11 Marlyne Beards

## 2011-05-01 NOTE — ED Notes (Signed)
[facility] in department to evaluate pt and process placement.  No change in pt condition.  Pt remains calm and cooperative.

## 2011-05-01 NOTE — ED Notes (Signed)
Pt given cheese and water per request. Denies complaints. Calmly watching tv at this time.

## 2011-05-03 NOTE — ED Provider Notes (Signed)
Medical screening examination/treatment/procedure(s) were conducted as a shared visit with non-physician practitioner(s) and myself.  I personally evaluated the patient during the encounter  Patient seen by me in and out note by mid-level reviewed. I have significant concern about the patient being suicidal we have asked behavioral health to evaluate the patient.  Shelda Jakes, MD 05/03/11 [phone]

## 2012-09-26 ENCOUNTER — Emergency Department (HOSPITAL_BASED_OUTPATIENT_CLINIC_OR_DEPARTMENT_OTHER)
Admission: EM | Admit: 2012-09-26 | Discharge: 2012-09-26 | Disposition: A | Discharge status: Home / Self Care | Payer: Federal, State, Local not specified - PPO | Attending: Emergency Medicine | Admitting: Emergency Medicine

## 2012-09-26 ENCOUNTER — Encounter (HOSPITAL_BASED_OUTPATIENT_CLINIC_OR_DEPARTMENT_OTHER): Payer: Self-pay | Admitting: *Deleted

## 2012-09-26 DIAGNOSIS — E7800 Pure hypercholesterolemia, unspecified: Secondary | ICD-10-CM | POA: Insufficient documentation

## 2012-09-26 DIAGNOSIS — Z794 Long term (current) use of insulin: Secondary | ICD-10-CM | POA: Insufficient documentation

## 2012-09-26 DIAGNOSIS — Z79899 Other long term (current) drug therapy: Secondary | ICD-10-CM | POA: Insufficient documentation

## 2012-09-26 DIAGNOSIS — E119 Type 2 diabetes mellitus without complications: Secondary | ICD-10-CM | POA: Insufficient documentation

## 2012-09-26 DIAGNOSIS — F3289 Other specified depressive episodes: Secondary | ICD-10-CM | POA: Insufficient documentation

## 2012-09-26 DIAGNOSIS — Z87891 Personal history of nicotine dependence: Secondary | ICD-10-CM | POA: Insufficient documentation

## 2012-09-26 DIAGNOSIS — F329 Major depressive disorder, single episode, unspecified: Secondary | ICD-10-CM | POA: Insufficient documentation

## 2012-09-26 DIAGNOSIS — Z760 Encounter for issue of repeat prescription: Secondary | ICD-10-CM | POA: Insufficient documentation

## 2012-09-26 DIAGNOSIS — F411 Generalized anxiety disorder: Secondary | ICD-10-CM | POA: Insufficient documentation

## 2012-09-26 MED ORDER — INSULIN GLARGINE 100 UNIT/ML ~~LOC~~ SOLN: 40.0000 [IU] | Freq: Every day | SUBCUTANEOUS | Status: AC

## 2012-09-26 NOTE — ED Provider Notes (Signed)
History     CSN: [id number]  Arrival date & time 09/26/12  1905   First MD Initiated Contact with Patient 09/26/12 2059      Chief Complaint  Patient presents with  . Medication Refill    HPI Patient denies any medical complaints. He ran of his insulin yesterday and needs a refill. We attempted to contact his primary Dr. but were unable to get a refill called in. The patient denies any polydipsia polyuria. Denies any complaints Past Medical History  Diagnosis Date  . Diabetes mellitus   . High cholesterol   . Depression   . Anxiety     Past Surgical History  Procedure Laterality Date  . Appendectomy      No family history on file.  History  Substance Use Topics  . Smoking status: Former Games developer  . Smokeless tobacco: Never Used  . Alcohol Use: No     Comment: former      Review of Systems  Constitutional: Negative for fever.  Endocrine: Negative for polydipsia and polyuria.    Allergies  Review of patient's allergies indicates no known allergies.  Home Medications   Current Outpatient Rx  Name  Route  Sig  Dispense  Refill  . glipiZIDE (GLUCOTROL) 5 MG tablet   Oral   Take 5 mg by mouth daily.           . insulin glargine (LANTUS SOLOSTAR) 100 UNIT/ML injection   Subcutaneous   Inject 0.4 mLs (40 Units total) into the skin at bedtime. Inject 40 units daily increasing by 4 units every 3 days until blood glucose level is less than 120mg /dL   10 mL   1   . Y-QMVHQIONGEXB-[id number] (METANX) 3-35-2 MG TABS   Oral   Take 1 tablet by mouth daily.           . metFORMIN (GLUCOPHAGE) 500 MG tablet   Oral   Take 500 mg by mouth 3 (three) times daily.           . simvastatin (ZOCOR) 40 MG tablet   Oral   Take 40 mg by mouth at bedtime.           Marland Kitchen thiothixene (NAVANE) 5 MG capsule   Oral   Take 5 mg by mouth at bedtime.             BP 100/82  Pulse 73  Temp(Src) 98.3 F (36.8 C) (Oral)  Resp 22  Wt 220 lb (99.791 kg)  BMI 30.7 kg/m2   SpO2 97%  Physical Exam  Nursing note and vitals reviewed. Constitutional: He appears well-developed and well-nourished. No distress.  HENT:  Head: Normocephalic and atraumatic.  Right Ear: External ear normal.  Left Ear: External ear normal.  Eyes: Conjunctivae are normal. Right eye exhibits no discharge. Left eye exhibits no discharge. No scleral icterus.  Neck: Neck supple. No tracheal deviation present.  Cardiovascular: Normal rate.   Pulmonary/Chest: Effort normal. No stridor. No respiratory distress.  Musculoskeletal: He exhibits no edema.  Neurological: He is alert. Cranial nerve deficit: no gross deficits.  Skin: Skin is warm and dry. No rash noted.  Psychiatric: He has a normal mood and affect.    ED Course  Procedures (including critical care time)  Labs Reviewed  GLUCOSE, CAPILLARY - Abnormal; Notable for the following:    Glucose-Capillary 146 (*)    All other components within normal limits  GLUCOSE, CAPILLARY - Abnormal; Notable for the following:    Glucose-Capillary 146 (*)  All other components within normal limits   No results found.   1. Encounter for medication refill       MDM  I prescribed insulin for the patient has requested        Celene Kras, MD 09/26/12 2131

## 2012-09-26 NOTE — ED Notes (Signed)
Ran out of Insulin yesterday. Needs a Rx

## 2012-09-26 NOTE — ED Notes (Signed)
Pt states that he is here tonight because he needs to get a refill on his Lantus Solostar Insulin Pen 100Units/mL.  Per label on medication pt is to inject 40 units daily increasing by 4 units every 3 days until his blood sugar levels are less than 120mg /dL.

## 2013-04-05 ENCOUNTER — Emergency Department (HOSPITAL_BASED_OUTPATIENT_CLINIC_OR_DEPARTMENT_OTHER): Payer: Medicare Other

## 2013-04-05 ENCOUNTER — Encounter (HOSPITAL_BASED_OUTPATIENT_CLINIC_OR_DEPARTMENT_OTHER): Payer: Self-pay

## 2013-04-05 ENCOUNTER — Observation Stay (HOSPITAL_BASED_OUTPATIENT_CLINIC_OR_DEPARTMENT_OTHER)
Admission: EM | Admit: 2013-04-05 | Discharge: 2013-04-06 | Disposition: A | Discharge status: Home / Self Care | Payer: Medicare Other | Attending: Internal Medicine | Admitting: Internal Medicine

## 2013-04-05 DIAGNOSIS — I10 Essential (primary) hypertension: Secondary | ICD-10-CM | POA: Insufficient documentation

## 2013-04-05 DIAGNOSIS — R197 Diarrhea, unspecified: Secondary | ICD-10-CM

## 2013-04-05 DIAGNOSIS — I951 Orthostatic hypotension: Secondary | ICD-10-CM

## 2013-04-05 DIAGNOSIS — E119 Type 2 diabetes mellitus without complications: Secondary | ICD-10-CM | POA: Diagnosis present

## 2013-04-05 DIAGNOSIS — Z7901 Long term (current) use of anticoagulants: Secondary | ICD-10-CM | POA: Insufficient documentation

## 2013-04-05 DIAGNOSIS — E860 Dehydration: Principal | ICD-10-CM | POA: Diagnosis present

## 2013-04-05 DIAGNOSIS — Z79899 Other long term (current) drug therapy: Secondary | ICD-10-CM | POA: Insufficient documentation

## 2013-04-05 DIAGNOSIS — I4891 Unspecified atrial fibrillation: Secondary | ICD-10-CM

## 2013-04-05 HISTORY — DX: Unspecified atrial fibrillation: I48.91

## 2013-04-05 HISTORY — DX: Type 2 diabetes mellitus without complications: E11.9

## 2013-04-05 LAB — COMPREHENSIVE METABOLIC PANEL
ALT: 60 U/L — ABNORMAL HIGH (ref 0–53)
AST: 53 U/L — ABNORMAL HIGH (ref 0–37)
CO2: 19 mEq/L (ref 19–32)
Chloride: 105 mEq/L (ref 96–112)
Creatinine, Ser: 0.9 mg/dL (ref 0.50–1.35)
GFR calc Af Amer: >90 mL/min (ref >90)
GFR calc non Af Amer: 89 mL/min — ABNORMAL LOW (ref >90)
Glucose, Bld: 130 mg/dL — ABNORMAL HIGH (ref 70–99)
Sodium: 137 mEq/L (ref 135–145)
Total Bilirubin: 1.1 mg/dL (ref 0.3–1.2)

## 2013-04-05 LAB — CBC WITH DIFFERENTIAL/PLATELET
Basophils Absolute: 0 10*3/uL (ref 0.0–0.1)
HCT: 47.2 % (ref 39.0–52.0)
Lymphocytes Relative: 28 % (ref 12–46)
Lymphs Abs: 3.9 10*3/uL (ref 0.7–4.0)
MCV: 88.6 fL (ref 78.0–100.0)
Monocytes Absolute: 1.7 10*3/uL — ABNORMAL HIGH (ref 0.1–1.0)
Neutro Abs: 8.3 10*3/uL — ABNORMAL HIGH (ref 1.7–7.7)
Platelets: 192 10*3/uL (ref 150–400)
RBC: 5.33 MIL/uL (ref 4.22–5.81)
RDW: 15.8 % — ABNORMAL HIGH (ref 11.5–15.5)
WBC: 14 10*3/uL — ABNORMAL HIGH (ref 4.0–10.5)

## 2013-04-05 LAB — URINALYSIS, ROUTINE W REFLEX MICROSCOPIC
Glucose, UA: NEGATIVE mg/dL
Nitrite: NEGATIVE
Urobilinogen, UA: 1 mg/dL (ref 0.0–1.0)

## 2013-04-05 LAB — TROPONIN I: Troponin I: <0.3 ng/mL (ref <0.30)

## 2013-04-05 LAB — URINE MICROSCOPIC-ADD ON

## 2013-04-05 LAB — GLUCOSE, CAPILLARY: Glucose-Capillary: 108 mg/dL — ABNORMAL HIGH (ref 70–99)

## 2013-04-05 LAB — PROTIME-INR
INR: 2.22 — ABNORMAL HIGH (ref 0.00–1.49)
Prothrombin Time: 23.9 seconds — ABNORMAL HIGH (ref 11.6–15.2)

## 2013-04-05 MED ORDER — SODIUM CHLORIDE 0.9 % IV BOLUS (SEPSIS)
1000.0000 mL | Freq: Once | INTRAVENOUS | Status: AC
  Administered 2013-04-05: 1000 mL via INTRAVENOUS

## 2013-04-05 MED ORDER — TAMSULOSIN HCL 0.4 MG PO CAPS
0.4000 mg | ORAL_CAPSULE | Freq: Every day | ORAL | Status: DC
  Filled 2013-04-05: qty 1

## 2013-04-05 MED ORDER — ONDANSETRON HCL 4 MG/2ML IJ SOLN: 4.0000 mg | Freq: Four times a day (QID) | INTRAMUSCULAR | Status: DC | PRN

## 2013-04-05 MED ORDER — METOPROLOL TARTRATE 25 MG PO TABS
25.0000 mg | ORAL_TABLET | Freq: Two times a day (BID) | ORAL | Status: DC
  Administered 2013-04-05: 25 mg via ORAL
  Filled 2013-04-05 (×3): qty 1

## 2013-04-05 MED ORDER — WARFARIN SODIUM 2 MG PO TABS
2.0000 mg | ORAL_TABLET | Freq: Every day | ORAL | Status: DC
  Filled 2013-04-05: qty 1

## 2013-04-05 MED ORDER — WARFARIN - PHYSICIAN DOSING INPATIENT: Freq: Every day | Status: DC

## 2013-04-05 MED ORDER — SODIUM CHLORIDE 0.9 % IV SOLN
INTRAVENOUS | Status: DC
  Administered 2013-04-06 (×2): via INTRAVENOUS

## 2013-04-05 MED ORDER — ONDANSETRON HCL 4 MG/2ML IJ SOLN
4.0000 mg | Freq: Once | INTRAMUSCULAR | Status: AC
  Administered 2013-04-05: 4 mg via INTRAVENOUS
  Filled 2013-04-05: qty 2

## 2013-04-05 MED ORDER — SIMVASTATIN 20 MG PO TABS
20.0000 mg | ORAL_TABLET | Freq: Every evening | ORAL | Status: DC
  Administered 2013-04-05: 20 mg via ORAL
  Filled 2013-04-05 (×2): qty 1

## 2013-04-05 MED ORDER — LISINOPRIL 10 MG PO TABS
10.0000 mg | ORAL_TABLET | Freq: Every day | ORAL | Status: DC
  Administered 2013-04-05: 10 mg via ORAL
  Filled 2013-04-05 (×2): qty 1

## 2013-04-05 MED ORDER — SODIUM CHLORIDE 0.9 % IJ SOLN
3.0000 mL | Freq: Two times a day (BID) | INTRAMUSCULAR | Status: DC
  Administered 2013-04-06: 3 mL via INTRAVENOUS

## 2013-04-05 MED ORDER — SERTRALINE HCL 100 MG PO TABS
100.0000 mg | ORAL_TABLET | Freq: Every day | ORAL | Status: DC
  Administered 2013-04-05 – 2013-04-06 (×2): 100 mg via ORAL
  Filled 2013-04-05 (×2): qty 1

## 2013-04-05 MED ORDER — IOHEXOL 300 MG/ML  SOLN
100.0000 mL | Freq: Once | INTRAMUSCULAR | Status: AC | PRN
  Administered 2013-04-05: 100 mL via INTRAVENOUS

## 2013-04-05 MED ORDER — SODIUM CHLORIDE 0.9 % IV SOLN
INTRAVENOUS | Status: AC
  Administered 2013-04-05: 20:00:00 via INTRAVENOUS

## 2013-04-05 MED ORDER — ONDANSETRON HCL 4 MG PO TABS: 4.0000 mg | ORAL_TABLET | Freq: Four times a day (QID) | ORAL | Status: DC | PRN

## 2013-04-05 MED ORDER — ONDANSETRON HCL 4 MG/2ML IJ SOLN: 4.0000 mg | Freq: Three times a day (TID) | INTRAMUSCULAR | Status: DC | PRN

## 2013-04-05 MED ORDER — IOHEXOL 300 MG/ML  SOLN
50.0000 mL | Freq: Once | INTRAMUSCULAR | Status: AC | PRN
  Administered 2013-04-05: 50 mL via ORAL

## 2013-04-05 MED ORDER — ASPIRIN EC 81 MG PO TBEC
81.0000 mg | DELAYED_RELEASE_TABLET | Freq: Every day | ORAL | Status: DC
  Administered 2013-04-05 – 2013-04-06 (×2): 81 mg via ORAL
  Filled 2013-04-05 (×2): qty 1

## 2013-04-05 MED ORDER — ZOLPIDEM TARTRATE 5 MG PO TABS
5.0000 mg | ORAL_TABLET | Freq: Every evening | ORAL | Status: DC | PRN
  Administered 2013-04-05: 5 mg via ORAL
  Filled 2013-04-05: qty 1

## 2013-04-05 NOTE — ED Notes (Signed)
Patient states he is able to eat and drink w/o difficulty, ate a hamburger for lunch, but has loose stools after eating. No meds taken

## 2013-04-05 NOTE — ED Notes (Signed)
.   Patient arrived from home by EMS for increased weakness and inability to get out of his chair this afternoon. Patient with flat affect and slow speech; slow to respond to questions. Reports diarrhea for the past week. Denies pain.

## 2013-04-05 NOTE — ED Notes (Addendum)
Spouse-Cynthia  [phone]

## 2013-04-05 NOTE — H&P (Signed)
[facility] History and Physical  [name] YQM:[id number] DOB: 22-Jan-1951    PCP:   None, getting care at [facility].  Chief Complaint: weakness.  HPI: [name] is an 62 y.o. male with hx of DM, HTN, afib on therapeutic level coumadin, depression, presents to [facility] with complaints of diarrhea last week, feeling weak, having no energy, and uable to stand up.  He has no fever, chills, nausea, vomiting, black or bloody stool, chest pain, or shortness of breath.  Evaluation there included a CBC which showed WBC of 14K with HB 16 grams per dL (? Hemoconcentration?), BUN/ Cr of 24/0.9, with EKG showing afib and no acute ST-T changes. Though his vitals were nonthreatening, he did have orthostasis.  His abdominal pelvic CT scan showed no acute processes.  He was accepted in transfer for dehydration, diarrhea, and weakness.    Rewiew of Systems:  Constitutional: Negative for fever and chills. No significant weight loss or weight gain Eyes: Negative for eye pain, redness and discharge, diplopia, visual changes, or flashes of light. ENMT: Negative for ear pain, hoarseness, nasal congestion, sinus pressure and sore throat. No headaches; tinnitus, drooling, or problem swallowing. Cardiovascular: Negative for chest pain, palpitations, diaphoresis, dyspnea and peripheral edema. ; No orthopnea, PND Respiratory: Negative for cough, hemoptysis, wheezing and stridor. No pleuritic chestpain. Gastrointestinal: Negative for nausea, vomiting, diarrhea, constipation, abdominal pain, melena, blood in stool, hematemesis, jaundice and rectal bleeding.    Genitourinary: Negative for frequency, dysuria, incontinence,flank pain and hematuria; Musculoskeletal: Negative for back pain and neck pain. Negative for swelling and trauma.;  Skin: . Negative for pruritus, rash, abrasions, bruising and skin lesion.; ulcerations Neuro: Negative for headache,  and neck stiffness. Negative for altered level of consciousness ,  altered mental status, extremity weakness, burning feet, involuntary movement, seizure and syncope.  Psych: negative for tearfulness, panic attacks, hallucinations, paranoia, suicidal or homicidal ideation   Past Medical History  Diagnosis Date  . Atrial fibrillation   . Diabetes mellitus without complication   . High cholesterol   . Depression     History reviewed. No pertinent past surgical history.  Medications:  HOME MEDS: Prior to Admission medications   Medication Sig Start Date End Date Taking? Authorizing Provider  aspirin EC 81 MG tablet Take 81 mg by mouth daily.   Yes Historical Provider, MD  lisinopril (PRINIVIL,ZESTRIL) 10 MG tablet Take 10 mg by mouth daily.   Yes Historical Provider, MD  metoprolol tartrate (LOPRESSOR) 25 MG tablet Take 25 mg by mouth 2 (two) times daily.   Yes Historical Provider, MD  sertraline (ZOLOFT) 100 MG tablet Take 100 mg by mouth daily.   Yes Historical Provider, MD  simvastatin (ZOCOR) 20 MG tablet Take 20 mg by mouth every evening.   Yes Historical Provider, MD  tamsulosin (FLOMAX) 0.4 MG CAPS capsule Take 0.4 mg by mouth.   Yes Historical Provider, MD  warfarin (COUMADIN) 2 MG tablet Take 2 mg by mouth daily.   Yes Historical Provider, MD     Allergies:  No Known Allergies  Social History:   reports that he does not use illicit drugs. His tobacco and alcohol histories are not on file.  Family History: No family history on file.   Physical Exam: Filed Vitals:   04/05/13 1813 04/05/13 1814 04/05/13 1941 04/05/13 2100  BP: 132/68 151/102 110/59 109/52  Pulse: 101 146  148  Temp:  99.2 F (37.3 C) 98.3 F (36.8 C) 98.3 F (36.8 C)  TempSrc:  Oral  Resp:  20 14 20   Height:    5\' 11"  (1.803 m)  Weight:    120.385 kg (265 lb 6.4 oz)  SpO2:  96% 96% 97%   Blood pressure 109/52, pulse 148, temperature 98.3 F (36.8 C), temperature source Oral, resp. rate 20, height 5\' 11"  (1.803 m), weight 120.385 kg (265 lb 6.4 oz), SpO2  97.00%.  GEN:  Pleasant  patient lying in the stretcher in no acute distress; cooperative with exam. PSYCH:  alert and oriented x4; does not appear anxious or depressed; affect is appropriate. HEENT: Mucous membranes pink and anicteric; PERRLA; EOM intact; no cervical lymphadenopathy nor thyromegaly or carotid bruit; no JVD; There were no stridor. Neck is very supple. Breasts:: Not examined CHEST WALL: No tenderness CHEST: Normal respiration, clear to auscultation bilaterally.  HEART: Irregular rate and rhythm.  There is a soft murmur at LSB. no rub, or gallops.   BACK: No kyphosis or scoliosis; no CVA tenderness ABDOMEN: soft and non-tender; no masses, no organomegaly, normal abdominal bowel sounds; no pannus; no intertriginous candida. There is no rebound and no distention. Rectal Exam: Not done EXTREMITIES: No bone or joint deformity; age-appropriate arthropathy of the hands and knees; no edema; no ulcerations.  There is no calf tenderness. Genitalia: not examined PULSES: 2+ and symmetric SKIN: Normal hydration no rash or ulceration CNS: Cranial nerves 2-12 grossly intact no focal lateralizing neurologic deficit.  Speech is fluent; uvula elevated with phonation, facial symmetry and tongue midline. DTR are normal bilaterally, cerebella exam is intact, barbinski is negative and strengths are equaled bilaterally.  No sensory loss.   Labs on Admission:  Basic Metabolic Panel:  Recent Labs Lab 04/05/13 1525  NA 137  K 4.1  CL 105  CO2 19  GLUCOSE 130*  BUN 24*  CREATININE 0.90  CALCIUM 10.0   Liver Function Tests:  Recent Labs Lab 04/05/13 1525  AST 53*  ALT 60*  ALKPHOS 79  BILITOT 1.1  PROT 7.1  ALBUMIN 4.3    Recent Labs Lab 04/05/13 1525  LIPASE 44   No results found for this basename: AMMONIA,  in the last 168 hours CBC:  Recent Labs Lab 04/05/13 1525  WBC 14.0*  NEUTROABS 8.3*  HGB 16.4  HCT 47.2  MCV 88.6  PLT 192   Cardiac Enzymes:  Recent  Labs Lab 04/05/13 1525  TROPONINI <0.30    CBG: No results found for this basename: GLUCAP,  in the last 168 hours   Radiological Exams on Admission: Ct Abdomen Pelvis W Contrast  04/05/2013   CLINICAL DATA:  Abdominal pain and diarrhea  EXAM: CT ABDOMEN AND PELVIS WITH CONTRAST  TECHNIQUE: Multidetector CT imaging of the abdomen and pelvis was performed using the standard protocol following bolus administration of intravenous contrast.  CONTRAST:  50mL OMNIPAQUE IOHEXOL 300 MG/ML SOLN, OMNIPAQUE IOHEXOL 300 MG/ML SOLN  COMPARISON:  None.  FINDINGS: The lung bases are free of acute infiltrate or sizable effusion. Diffuse fatty infiltration of the liver is noted. The gallbladder demonstrates a large calcified gallstone. No gallbladder wall thickening is seen. The spleen, adrenal glands and pancreas are normal in their appearance. The kidneys are well visualized bilaterally and reveal a normal enhancement. Delayed images demonstrate normal excretion. A small, less than 1 cm exophytic hyperdense lesion is noted. It is too small for adequate characterization but likely represents a small hyperdense cyst. It is seen in the inferior aspect of the right kidney on image number 45 of series 2. The  bladder is well distended with non-opacified urine. No pelvic mass lesion or sidewall abnormality is noted. Mild diverticular change is seen without diverticulitis. The appendix is not well visualized although no inflammatory changes are seen.  IMPRESSION: Small less than 1 cm hyperdense lesion arising in the posterior aspect of the right kidney likely representing a hyperdense cyst. It is too small for adequate characterization on this examination. Followup in 3-6 months to assess for stability is recommended.  Fatty infiltration of the liver.  Cholelithiasis without complicating factors.  No acute abnormality is seen.   Electronically Signed   By: Alcide Clever M.D.   On: 04/05/2013 17:51    EKG: Independently  reviewed. Afib, rate OK, no acute ST-T changes.   Assessment/Plan Present on Admission:  . Dehydration . Diabetes Hyperlipidemia Generalized weakness.  PLAN:  His diarrhea has resolved, no sense in getting C diff or stool studies, as I saw his stool tonight.  It is rather solid.  He is dehydrated however, and will receive IVF.  He has been therapeutic on Coumadin, so I have continued his home dose of Coumadin.  For his DM, will continue his meds.  I suspect if he feels well, he can be safely discharged home in the morning.  Thank you for allowing me to participate in his care.  Other plans as per orders.  Code Status: FULL Unk Lightning, MD. [facility] Pager [phone] 7pm to 7am.  04/05/2013, 9:53 PM

## 2013-04-05 NOTE — ED Provider Notes (Signed)
CSN: [id number]     Arrival date & time 04/05/13  1450 History  This chart was scribed for Glynn Octave, MD by Ardelia Mems, ED Scribe. This patient was seen in room MH03/MH03 and the patient's care was started at 2:59 PM.  Chief Complaint  Patient presents with  . Weakness    The history is provided by the patient, a caregiver and the EMS personnel. No language interpreter was used.    HPI Comments: [name] is a 62 y.o. male with a history of DM brought by EMS, and accompanied by caregiver, to the Emergency Department complaining of multiple daily episodes of watery diarrhea over the past week. He is unable to quantify how many episodes of diarrhea he has had. Pt denies the presence of blood in his stool, and states that producing stool does not cause him pain. Pt also reports associated weakness over the past week. He states that he has been eating normally. He also has a history of depression and per EMS, he reported increased irritability. Pt has a history of Atrial Fibrillation and is on Coumadin. He denies sick contacts, recent travel or recent antibiotic use. He denies fever, abdominal pain, emesis, cough or any other symptoms.   Past Medical History  Diagnosis Date  . Atrial fibrillation   . Diabetes mellitus without complication   . High cholesterol   . Depression    History reviewed. No pertinent past surgical history.  No family history on file.  History  Substance Use Topics  . Smoking status: Not on file  . Smokeless tobacco: Not on file  . Alcohol Use: Not on file    Review of Systems A complete 10 system review of systems was obtained and all systems are negative except as noted in the HPI and PMH.   Allergies  Review of patient's allergies indicates no known allergies.  Home Medications   Current Outpatient Rx  Name  Route  Sig  Dispense  Refill  . aspirin EC 81 MG tablet   Oral   Take 81 mg by mouth daily.         Marland Kitchen lisinopril  (PRINIVIL,ZESTRIL) 10 MG tablet   Oral   Take 10 mg by mouth daily.         . metoprolol tartrate (LOPRESSOR) 25 MG tablet   Oral   Take 25 mg by mouth 2 (two) times daily.         . sertraline (ZOLOFT) 100 MG tablet   Oral   Take 100 mg by mouth daily.         . simvastatin (ZOCOR) 20 MG tablet   Oral   Take 20 mg by mouth every evening.         . tamsulosin (FLOMAX) 0.4 MG CAPS capsule   Oral   Take 0.4 mg by mouth.         . warfarin (COUMADIN) 2 MG tablet   Oral   Take 2 mg by mouth daily.          Triage Vitals: BP 143/73  Pulse 93  Temp(Src) 98.2 F (36.8 C) (Oral)  Resp 20  Ht 5\' 11"  (1.803 m)  Wt 270 lb (122.471 kg)  BMI 37.67 kg/m2  SpO2 100%  Physical Exam  Nursing note and vitals reviewed. Constitutional: He is oriented to person, place, and time. He appears well-developed and well-nourished. No distress.  HENT:  Head: Normocephalic and atraumatic.  Dry mucous membranes.  Eyes: EOM are normal.  Neck: Neck supple. No tracheal deviation present.  Cardiovascular: Intact distal pulses.   Tachycardic, irregular rhythm.  Pulmonary/Chest: Effort normal. No respiratory distress.  Abdominal: Soft. There is no tenderness.  Scattered ecchymosis (pt states insulin injections)  Musculoskeletal: Normal range of motion.  Resting tremors of bilateral upper extremities.  Neurological: He is alert and oriented to person, place, and time.  Skin: Skin is warm and dry.  Psychiatric: He has a normal mood and affect. His behavior is normal.    ED Course  Procedures (including critical care time)  DIAGNOSTIC STUDIES: Oxygen Saturation is 100% on RA, normal by my interpretation.    COORDINATION OF CARE: 3:06 PM- Discussed plan for pt to receive IV fluids and Zofran. Also discussed plan to obtain blood work and other diagnostic labs. Pt and caregiver advised of plan for treatment and pt agrees.  5:46 PM- Recheck with pt and pt informed that his kidneys and  liver appear normal. Pt states that he has not had any diarrhea since last check. He states that he is now having abdominal pain. Discussed that urine has some bacteria, but a UTI is not suspected.  6:00 PM- Recheck with pt and he now states that he is unable to walk without assistance, and he states that he does not have assistance at home. He states that he needs to takes care of his wife at home. He states a preference for going to [facility] if he is to be admitted. He also states "if you just give me some tranquilizers, I'll be fine".   Medications  sodium chloride 0.9 % bolus 1,000 mL (1,000 mLs Intravenous New Bag/Given 04/05/13 1818)  0.9 %  sodium chloride infusion (not administered)  ondansetron (ZOFRAN) injection 4 mg (not administered)  sodium chloride 0.9 % bolus 1,000 mL (0 mLs Intravenous Stopped 04/05/13 1731)  ondansetron (ZOFRAN) injection 4 mg (4 mg Intravenous Given 04/05/13 1550)  iohexol (OMNIPAQUE) 300 MG/ML solution 50 mL (50 mLs Oral Contrast Given 04/05/13 1644)  iohexol (OMNIPAQUE) 300 MG/ML solution 100 mL (100 mLs Intravenous Contrast Given 04/05/13 1728)   Labs Review Labs Reviewed  CBC WITH DIFFERENTIAL - Abnormal; Notable for the following:    WBC 14.0 (*)    RDW 15.8 (*)    Neutro Abs 8.3 (*)    Monocytes Absolute 1.7 (*)    All other components within normal limits  COMPREHENSIVE METABOLIC PANEL - Abnormal; Notable for the following:    Glucose, Bld 130 (*)    BUN 24 (*)    AST 53 (*)    ALT 60 (*)    GFR calc non Af Amer 89 (*)    All other components within normal limits  URINALYSIS, ROUTINE W REFLEX MICROSCOPIC - Abnormal; Notable for the following:    Color, Urine AMBER (*)    Bilirubin Urine SMALL (*)    Ketones, ur 15 (*)    Leukocytes, UA TRACE (*)    All other components within normal limits  PROTIME-INR - Abnormal; Notable for the following:    Prothrombin Time 23.9 (*)    INR 2.22 (*)    All other components within normal limits  OCCULT BLOOD X  1 CARD TO LAB, STOOL - Abnormal; Notable for the following:    Fecal Occult Bld POSITIVE (*)    All other components within normal limits  URINE MICROSCOPIC-ADD ON - Abnormal; Notable for the following:    Bacteria, UA MANY (*)    Crystals CA OXALATE CRYSTALS (*)  All other components within normal limits  CLOSTRIDIUM DIFFICILE BY PCR  LIPASE, BLOOD  TROPONIN I  CG4 I-STAT (LACTIC ACID)   Imaging Review Ct Abdomen Pelvis W Contrast  04/05/2013   CLINICAL DATA:  Abdominal pain and diarrhea  EXAM: CT ABDOMEN AND PELVIS WITH CONTRAST  TECHNIQUE: Multidetector CT imaging of the abdomen and pelvis was performed using the standard protocol following bolus administration of intravenous contrast.  CONTRAST:  50mL OMNIPAQUE IOHEXOL 300 MG/ML SOLN, OMNIPAQUE IOHEXOL 300 MG/ML SOLN  COMPARISON:  None.  FINDINGS: The lung bases are free of acute infiltrate or sizable effusion. Diffuse fatty infiltration of the liver is noted. The gallbladder demonstrates a large calcified gallstone. No gallbladder wall thickening is seen. The spleen, adrenal glands and pancreas are normal in their appearance. The kidneys are well visualized bilaterally and reveal a normal enhancement. Delayed images demonstrate normal excretion. A small, less than 1 cm exophytic hyperdense lesion is noted. It is too small for adequate characterization but likely represents a small hyperdense cyst. It is seen in the inferior aspect of the right kidney on image number 45 of series 2. The bladder is well distended with non-opacified urine. No pelvic mass lesion or sidewall abnormality is noted. Mild diverticular change is seen without diverticulitis. The appendix is not well visualized although no inflammatory changes are seen.  IMPRESSION: Small less than 1 cm hyperdense lesion arising in the posterior aspect of the right kidney likely representing a hyperdense cyst. It is too small for adequate characterization on this examination. Followup  in 3-6 months to assess for stability is recommended.  Fatty infiltration of the liver.  Cholelithiasis without complicating factors.  No acute abnormality is seen.   Electronically Signed   By: Alcide Clever M.D.   On: 04/05/2013 17:51    MDM   1. Orthostatic hypotension   2. Diarrhea   3. Atrial fibrillation    1 week history of loose frequent stools the patient is unable to quantify. No vomiting, no abdominal pain, no fever. No recent travel, no sick contacts or antibiotic use. Vitals stable. Abdomen soft without peritoneal signs  Labs remarkable for leukocytosis of 14. Bacteruria without WBCs, LE, or nitrite. LFTs and lipase normal. Lactate normal. Abdomen soft without peritoneal signs.  CT scan does not show any acute findings. Patient remains very weak and unable to stand or an early without assistance. Heart rate increases to 150s when he stands up. He is shaky and unable to stand on his own. He is the primary caregiver for his bedridden wife but has assistance during the day.  Patient unable to provide a stool sample for C. difficile studies. He'll be admitted given his ongoing tachycardia with standing and inability to ambulate without assistance. D/w Dr. Lendell Caprice.   Date: 04/05/2013  Rate: 96  Rhythm: atrial fibrillation  QRS Axis: normal  Intervals: normal  ST/T Wave abnormalities: nonspecific ST/T changes  Conduction Disutrbances:none  Narrative Interpretation:   Old EKG Reviewed: none available    I personally performed the services described in this documentation, which was scribed in my presence. The recorded information has been reviewed and is accurate.                     Glynn Octave, MD 04/05/13 [id number]

## 2013-04-05 NOTE — Progress Notes (Addendum)
Accepted from [place] [facility]. 62 year old male who gets his care at the [place] presents with diarrhea and weakness. He's had several days of diarrhea. White blood cell count is slightly elevated. CT of the abdomen pelvis shows nothing acute. Patient is very weak and unable to ambulate. C. difficile ordered, but no stool in the ED. Heme positive on rectal exam but no gross blood. Patient is on anticoagulation for atrial fibrillation. Dehydrated. Accepted to telemetry.  Crista Curb, M.D.

## 2013-04-06 ENCOUNTER — Encounter (HOSPITAL_COMMUNITY): Payer: Self-pay

## 2013-04-06 LAB — PROTIME-INR
INR: 3.07 — ABNORMAL HIGH (ref 0.00–1.49)
Prothrombin Time: 30.6 seconds — ABNORMAL HIGH (ref 11.6–15.2)

## 2013-04-06 LAB — TSH: TSH: 2.817 u[IU]/mL (ref 0.350–4.500)

## 2013-04-06 MED ORDER — METOPROLOL TARTRATE 12.5 MG HALF TABLET
37.5000 mg | ORAL_TABLET | Freq: Two times a day (BID) | ORAL | Status: DC
  Filled 2013-04-06: qty 1

## 2013-04-06 MED ORDER — METOPROLOL TARTRATE 25 MG PO TABS: 37.5000 mg | ORAL_TABLET | Freq: Two times a day (BID) | ORAL | Status: AC

## 2013-04-06 MED ORDER — METOPROLOL TARTRATE 12.5 MG HALF TABLET
12.5000 mg | ORAL_TABLET | Freq: Once | ORAL | Status: AC
  Administered 2013-04-06: 12.5 mg via ORAL
  Filled 2013-04-06: qty 1

## 2013-04-06 MED ORDER — LISINOPRIL 5 MG PO TABS: 5.0000 mg | ORAL_TABLET | Freq: Every day | ORAL | Status: DC

## 2013-04-06 MED ORDER — METOPROLOL TARTRATE 25 MG PO TABS
25.0000 mg | ORAL_TABLET | Freq: Once | ORAL | Status: AC
  Administered 2013-04-06: 25 mg via ORAL
  Filled 2013-04-06: qty 1

## 2013-04-06 MED ORDER — METOPROLOL TARTRATE 12.5 MG HALF TABLET: 37.5000 mg | ORAL_TABLET | Freq: Two times a day (BID) | ORAL | Status: DC

## 2013-04-06 MED ORDER — WARFARIN SODIUM 2 MG PO TABS: 2.0000 mg | ORAL_TABLET | Freq: Every day | ORAL | Status: DC

## 2013-04-06 NOTE — Progress Notes (Signed)
04/06/2013 3:21 PM Nursing note Pt. Ambulated 150 ft in hallway with RN and SN on RA. Pt. Tolerated well. O2 saturation remained greater than 90% during walk and pt. Denies dizziness, SOB or palpitations. BP checked before and after walk. See doc flow sheets. Pt. HR did elevate to 130-140 Afib/flutter while ambulating. Dr. Benjamine Mola paged and made aware. No new orders at this time. Will continue to monitor patient.  Felica Chargois, Blanchard Kelch

## 2013-04-06 NOTE — Progress Notes (Signed)
04/06/2013 10:56 AM Nursing note Clarified orders with Dr. Benjamine Mola that MD wanted pt. To receive a total of 37.5 mg of Metoprolol PO this morning. Verbal orders to give one time dose of Metoprolol 25 mg along with scheduled 12.5 mg Metoprolol to reach total of 37.5 mg. Orders enacted and pt. Updated on on plan.  [name], Blanchard Kelch

## 2013-04-06 NOTE — Progress Notes (Signed)
UR Completed.  [name] Jane [phone] 04/06/2013  

## 2013-04-06 NOTE — Progress Notes (Signed)
04/06/2013 0915 Nursing note During pt ambulated to BR. HR noted to be elevated to 135 and then back to Afib rate 104. Pt. Asymptomatic. Denies dizziness or SOB. Pt. Assisted back to bed. Pt. HR Afib 90-110. Dr. Benjamine Mola paged and made aware. No new orders at this time. Will continue to monitor patient.  Hermione Havlicek, Blanchard Kelch

## 2013-04-06 NOTE — Progress Notes (Signed)
Pharmacy  - Warfarin per MD  INR today = 3.07, most likely trending up due to recent diarrhea  Plan: 1) Restarted Coumadin 2 mg daily for tomorrow 10/7 2) Daily INR  Thank you. Okey Regal, PharmD [phone]

## 2013-04-06 NOTE — Discharge Summary (Signed)
Physician Discharge Summary  [name] NFA:[id number] DOB: [birth year] DOA: 04/05/2013  PCP: [facility]  Admit date: 04/05/2013 Discharge date: 04/06/2013  Time spent: 35 minutes  Recommendations for Outpatient Follow-up:  See below  Discharge Diagnoses:  Active Problems:   Atrial fibrillation   Diarrhea   Orthostatic hypotension   Dehydration   Diabetes   Discharge Condition: improved  Diet recommendation: cardiac  Filed Weights   04/05/13 1453 04/05/13 2100  Weight: 122.471 kg (270 lb) 120.385 kg (265 lb 6.4 oz)    History of present illness:  [name] is an 62 y.o. male with hx of DM, HTN, afib on therapeutic level coumadin, depression, presents to [facility] with complaints of diarrhea last week, feeling weak, having no energy, and uable to stand up. He has no fever, chills, nausea, vomiting, black or bloody stool, chest pain, or shortness of breath. Evaluation there included a CBC which showed WBC of 14K with HB 16 grams per dL (? Hemoconcentration?), BUN/ Cr of 24/0.9, with EKG showing afib and no acute ST-T changes. Though his vitals were nonthreatening, he did have orthostasis. His abdominal pelvic CT scan showed no acute processes. He was accepted in transfer for dehydration, diarrhea, and weakness   Hospital Course:  Patient improved- increased rate controlled medications- not very active at baseline diarrhea- resolved  Procedures:  none  Consultations:  none  Discharge Exam: Filed Vitals:   04/06/13 1445  BP: 130/83  Pulse: 94  Temp:   Resp:     General: A+Ox3, NAd Cardiovascular: irr Respiratory: clear anterior  Discharge Instructions      Discharge Orders   Future Orders Complete By Expires   Diet - low sodium heart healthy  As directed    Discharge instructions  As directed    Comments:     Hold coumadin tonight PT/INR on Wednesday Cbc, bmp 1 week BP check on Wednesday Small less than 1 cm hyperdense lesion arising in the  posterior aspect of the right kidney likely representing a hyperdense cyst. It is too small for adequate characterization on this examination. Followup in 3-6 months to assess for stability is recommended.   Increase activity slowly  As directed        Medication List    STOP taking these medications       lisinopril 10 MG tablet  Commonly known as:  PRINIVIL,ZESTRIL      TAKE these medications       aspirin EC 81 MG tablet  Take 81 mg by mouth daily.     donepezil 10 MG tablet  Commonly known as:  ARICEPT  Take 10 mg by mouth daily.     LANTUS SOLOSTAR 100 UNIT/ML Sopn  Generic drug:  Insulin Glargine  Inject 40 Units into the skin daily. Increase by 4 units every 3 days until CBG < 130     levothyroxine 25 MCG tablet  Commonly known as:  SYNTHROID, LEVOTHROID  Take 25 mcg by mouth daily before breakfast.     metFORMIN 500 MG tablet  Commonly known as:  GLUCOPHAGE  Take 500 mg by mouth daily.     metoprolol tartrate 12.5 mg Tabs tablet  Commonly known as:  LOPRESSOR  Take 1.5 tablets (37.5 mg total) by mouth 2 (two) times daily.     omeprazole 20 MG capsule  Commonly known as:  PRILOSEC  Take 20 mg by mouth daily.     risperidone 4 MG tablet  Commonly known as:  RISPERDAL  Take 2  mg by mouth at bedtime.     sertraline 100 MG tablet  Commonly known as:  ZOLOFT  Take 200 mg by mouth daily.     simvastatin 80 MG tablet  Commonly known as:  ZOCOR  Take 80 mg by mouth daily.     tamsulosin 0.4 MG Caps capsule  Commonly known as:  FLOMAX  Take 0.4 mg by mouth at bedtime.     warfarin 5 MG tablet  Commonly known as:  COUMADIN  Take 12.5-15 mg by mouth daily. Take 2.5 tabs everyday except take 3 tabs on Monday       No Known Allergies    The results of significant diagnostics from this hospitalization (including imaging, microbiology, ancillary and laboratory) are listed below for reference.    Significant Diagnostic Studies: Ct Abdomen Pelvis W  Contrast  04/05/2013   CLINICAL DATA:  Abdominal pain and diarrhea  EXAM: CT ABDOMEN AND PELVIS WITH CONTRAST  TECHNIQUE: Multidetector CT imaging of the abdomen and pelvis was performed using the standard protocol following bolus administration of intravenous contrast.  CONTRAST:  50mL OMNIPAQUE IOHEXOL 300 MG/ML SOLN, OMNIPAQUE IOHEXOL 300 MG/ML SOLN  COMPARISON:  None.  FINDINGS: The lung bases are free of acute infiltrate or sizable effusion. Diffuse fatty infiltration of the liver is noted. The gallbladder demonstrates a large calcified gallstone. No gallbladder wall thickening is seen. The spleen, adrenal glands and pancreas are normal in their appearance. The kidneys are well visualized bilaterally and reveal a normal enhancement. Delayed images demonstrate normal excretion. A small, less than 1 cm exophytic hyperdense lesion is noted. It is too small for adequate characterization but likely represents a small hyperdense cyst. It is seen in the inferior aspect of the right kidney on image number 45 of series 2. The bladder is well distended with non-opacified urine. No pelvic mass lesion or sidewall abnormality is noted. Mild diverticular change is seen without diverticulitis. The appendix is not well visualized although no inflammatory changes are seen.  IMPRESSION: Small less than 1 cm hyperdense lesion arising in the posterior aspect of the right kidney likely representing a hyperdense cyst. It is too small for adequate characterization on this examination. Followup in 3-6 months to assess for stability is recommended.  Fatty infiltration of the liver.  Cholelithiasis without complicating factors.  No acute abnormality is seen.   Electronically Signed   By: Alcide Clever M.D.   On: 04/05/2013 17:51    Microbiology: No results found for this or any previous visit (from the past 240 hour(s)).   Labs: Basic Metabolic Panel:  Recent Labs Lab 04/05/13 1525  NA 137  K 4.1  CL 105  CO2 19   GLUCOSE 130*  BUN 24*  CREATININE 0.90  CALCIUM 10.0   Liver Function Tests:  Recent Labs Lab 04/05/13 1525  AST 53*  ALT 60*  ALKPHOS 79  BILITOT 1.1  PROT 7.1  ALBUMIN 4.3    Recent Labs Lab 04/05/13 1525  LIPASE 44   No results found for this basename: AMMONIA,  in the last 168 hours CBC:  Recent Labs Lab 04/05/13 1525  WBC 14.0*  NEUTROABS 8.3*  HGB 16.4  HCT 47.2  MCV 88.6  PLT 192   Cardiac Enzymes:  Recent Labs Lab 04/05/13 1525  TROPONINI <0.30   BNP: BNP (last 3 results) No results found for this basename: PROBNP,  in the last 8760 hours CBG:  Recent Labs Lab 04/05/13 2241  GLUCAP 108*  SignedMarlin Canary  [facility] 04/06/2013, 4:02 PM

## 2013-04-06 NOTE — Progress Notes (Signed)
Pt's rhythm converted to NSR just before midnight.  He says that he feels better this morning, stronger and can walk now.  No diarrhea during th night.

## 2013-04-06 NOTE — Progress Notes (Signed)
04/06/2013 5:00 PM Nursing note Discharge avs form, medications already given today and those due this evening given and explained to patient and caregiver. Rx given to patient and new dosing explained for metoprolol. Follow up appointments, coumadin dosing, when to call MD and safe Metformin resumption reviewed. INR check, lab work and BP check for Wednesday at [facility] hospital reviewed. Questions and concerns addressed. D/c iv line. D/c tele. D/c home with caregivers per orders.  Iviana Blasingame, Blanchard Kelch

## 2013-04-07 ENCOUNTER — Encounter (HOSPITAL_BASED_OUTPATIENT_CLINIC_OR_DEPARTMENT_OTHER): Payer: Self-pay | Admitting: *Deleted

## 2014-03-03 ENCOUNTER — Ambulatory Visit (HOSPITAL_COMMUNITY)
Admission: RE | Admit: 2014-03-03 | Discharge: 2014-03-03 | Disposition: A | Discharge status: Home / Self Care | Payer: Federal, State, Local not specified - PPO | Attending: Psychiatry | Admitting: Psychiatry

## 2014-03-04 NOTE — BH Assessment (Signed)
Tele Assessment Note   [name] is a 63 y.o. male who presented as a walk-in to [facility].  Pt states that he is stressed out b/c of his current family issues--(1)he is the caregiver for his wife; (2) he is problems with family and a trust fund and (3) addt'l family members have moved in his home.  Pt denies SI/HI/AVH, stating--"I just need somewhere to stay for while until I feel better, the last time I came here, I had group therapy and I could to talk to people I didn't know".  Pt answered most interview questions with "I don't know".  Pt does not meet criteria for inpt admission.  [facility]  Axis I: Mood Disorder NOS Axis II: Deferred Axis III:  Past Medical History  Diagnosis Date  . Diabetes mellitus   . Anxiety   . Atrial fibrillation   . Diabetes mellitus without complication   . High cholesterol   . Depression    Axis IV: other psychosocial or environmental problems, problems related to social environment and problems with primary support group Axis V: 51-60 moderate symptoms  Past Medical History:  Past Medical History  Diagnosis Date  . Diabetes mellitus   . Anxiety   . Atrial fibrillation   . Diabetes mellitus without complication   . High cholesterol   . Depression     Past Surgical History  Procedure Laterality Date  . Appendectomy      Family History: No family history on file.  Social History:  reports that he has quit smoking. He does not have any smokeless tobacco history on file. He reports that he does not drink alcohol or use illicit drugs.  Additional Social History:  Alcohol / Drug Use Pain Medications: See MAR  Prescriptions: See MAR  Over the Counter: See MAR History of alcohol / drug use?: No history of alcohol / drug abuse Longest period of sobriety (when/how long): None   CIWA:   COWS:    PATIENT STRENGTHS: (choose at least two) Psychologist, counselling  means  Allergies: No Known Allergies  Home Medications:  (Not in a hospital admission)  OB/GYN Status:  No LMP for male patient.  General Assessment Data Location of Assessment: [facility] Is this a Tele or Face-to-Face Assessment?: Face-to-Face Is this an Initial Assessment or a Re-assessment for this encounter?: Initial Assessment Living Arrangements: Spouse/significant other Can pt return to current living arrangement?: Yes Admission Status: Voluntary Is patient capable of signing voluntary admission?: Yes Transfer from: Acute Hospital Referral Source: MD  Medical Screening Exam [facility] Walk-in ONLY) Medical Exam completed: No Reason for MSE not completed: Other: (None )  [facility] Crisis Care Plan Living Arrangements: Spouse/significant other [facility]  [facility]   Education Status Is patient currently in school?: No Current Grade: None  Highest grade of school patient has completed: None  Name of school: None  Contact person: None   Risk to self with the past 6 months Suicidal Ideation: No Suicidal Intent: No Is patient at risk for suicide?: No Suicidal Plan?: No Access to Means: No What has been your use of drugs/alcohol within the last 12 months?: None  Previous Attempts/Gestures: No How many times?: 0 Other Self Harm Risks: None  Triggers for Past Attempts: None known Intentional Self Injurious Behavior: None Family Suicide History: No Recent stressful life event(s): Conflict (Comment) (Family issues ) Persecutory voices/beliefs?: No Depression: Yes Depression Symptoms:  Feeling angry/irritable Substance abuse history and/or treatment for substance abuse?: No Suicide prevention information given to non-admitted patients: Not applicable  Risk to Others within the past 6 months Homicidal Ideation: No Thoughts of Harm to Others: No Current Homicidal Intent: No Current Homicidal Plan: No Access to Homicidal Means:  No Identified Victim: None  History of harm to others?: No Assessment of Violence: None Noted Violent Behavior Description: None  Does patient have access to weapons?: No Criminal Charges Pending?: No Does patient have a court date: No  Psychosis Hallucinations: None noted Delusions: None noted  Mental Status Report Appear/Hygiene: Disheveled Eye Contact: Good Motor Activity: Unremarkable Speech: Logical/coherent;Slow Level of Consciousness: Alert;Irritable Mood: Anxious;Irritable Affect: Irritable;Anxious Anxiety Level: Minimal Thought Processes: Coherent;Relevant Judgement: Unimpaired Orientation: Person;Place;Time;Situation Obsessive Compulsive Thoughts/Behaviors: None  Cognitive Functioning Concentration: Decreased Memory: Recent Intact;Remote Intact IQ: Average Insight: Good Impulse Control: Good Appetite: Good Weight Loss: 0 Weight Gain: 0 Sleep: No Change Total Hours of Sleep: 6 Vegetative Symptoms: None  ADLScreening [facility] Assessment Services) Patient's cognitive ability adequate to safely complete daily activities?: Yes Patient able to express need for assistance with ADLs?: Yes Independently performs ADLs?: Yes (appropriate for developmental age)  Prior Inpatient Therapy Prior Inpatient Therapy: Yes Prior Therapy Dates: 2009,2012 Prior Therapy Facilty/Provider(s): [facility]  Reason for Treatment: Psychosis   Prior Outpatient Therapy Prior Outpatient Therapy: Yes Prior Therapy Dates: Current  [facility] Reason for Treatment: Med mgt/Therapy   ADL Screening (condition at time of admission) Patient's cognitive ability adequate to safely complete daily activities?: Yes Is the patient deaf or have difficulty hearing?: No Does the patient have difficulty seeing, even when wearing glasses/contacts?: No Does the patient have difficulty concentrating, remembering, or making decisions?: No Patient able to express need for assistance  with ADLs?: Yes Does the patient have difficulty dressing or bathing?: No Independently performs ADLs?: Yes (appropriate for developmental age) Does the patient have difficulty walking or climbing stairs?: No Weakness of Legs: None Weakness of Arms/Hands: None  Home Assistive Devices/Equipment Home Assistive Devices/Equipment: None  Therapy Consults (therapy consults require a physician order) PT Evaluation Needed: No OT Evalulation Needed: No SLP Evaluation Needed: No Abuse/Neglect Assessment (Assessment to be complete while patient is alone) Physical Abuse: Denies Verbal Abuse: Denies Sexual Abuse: Denies Exploitation of patient/patient's resources: Denies Self-Neglect: Denies Values / Beliefs Cultural Requests During Hospitalization: None Spiritual Requests During Hospitalization: None Consults Spiritual Care Consult Needed: No Social Work Consult Needed: No Merchant navy officer (For Healthcare) Does patient have an advance directive?: No Would patient like information on creating an advanced directive?: No - patient declined information Nutrition Screen- [facility] Adult/WL/AP Patient's home diet: Regular  Additional Information 1:1 In Past 12 Months?: No CIRT Risk: No Elopement Risk: No Does patient have medical clearance?: Yes     Disposition:  Disposition Initial Assessment Completed for this Encounter: Yes Disposition of Patient: Referred to;Outpatient treatment (Referred back to current provider ) Type of outpatient treatment: Adult (Referred back to current provider ) Patient referred to: Other (Comment) (Pt referred back to current provider )  Beatrix Shipper C 03/04/2014 2:07 AM

## 2018-02-27 ENCOUNTER — Other Ambulatory Visit: Payer: Self-pay

## 2018-02-27 ENCOUNTER — Emergency Department (HOSPITAL_COMMUNITY): Payer: Medicare Other

## 2018-02-27 ENCOUNTER — Inpatient Hospital Stay (HOSPITAL_COMMUNITY)
Admission: EM | Admit: 2018-02-27 | Discharge: 2018-03-04 | DRG: 190 | Disposition: A | Discharge status: Skilled Nursing Facility | Payer: Medicare Other | Attending: Internal Medicine | Admitting: Internal Medicine

## 2018-02-27 ENCOUNTER — Encounter (HOSPITAL_COMMUNITY): Payer: Self-pay

## 2018-02-27 DIAGNOSIS — I4891 Unspecified atrial fibrillation: Secondary | ICD-10-CM | POA: Diagnosis present

## 2018-02-27 DIAGNOSIS — R0902 Hypoxemia: Secondary | ICD-10-CM | POA: Diagnosis not present

## 2018-02-27 DIAGNOSIS — E1165 Type 2 diabetes mellitus with hyperglycemia: Secondary | ICD-10-CM | POA: Diagnosis present

## 2018-02-27 DIAGNOSIS — I10 Essential (primary) hypertension: Secondary | ICD-10-CM | POA: Diagnosis present

## 2018-02-27 DIAGNOSIS — T380X5A Adverse effect of glucocorticoids and synthetic analogues, initial encounter: Secondary | ICD-10-CM | POA: Diagnosis present

## 2018-02-27 DIAGNOSIS — Z7982 Long term (current) use of aspirin: Secondary | ICD-10-CM

## 2018-02-27 DIAGNOSIS — E119 Type 2 diabetes mellitus without complications: Secondary | ICD-10-CM

## 2018-02-27 DIAGNOSIS — F0390 Unspecified dementia without behavioral disturbance: Secondary | ICD-10-CM | POA: Diagnosis present

## 2018-02-27 DIAGNOSIS — Z7901 Long term (current) use of anticoagulants: Secondary | ICD-10-CM

## 2018-02-27 DIAGNOSIS — E785 Hyperlipidemia, unspecified: Secondary | ICD-10-CM | POA: Diagnosis present

## 2018-02-27 DIAGNOSIS — Z794 Long term (current) use of insulin: Secondary | ICD-10-CM | POA: Diagnosis not present

## 2018-02-27 DIAGNOSIS — J441 Chronic obstructive pulmonary disease with (acute) exacerbation: Secondary | ICD-10-CM | POA: Diagnosis not present

## 2018-02-27 DIAGNOSIS — F419 Anxiety disorder, unspecified: Secondary | ICD-10-CM | POA: Diagnosis present

## 2018-02-27 DIAGNOSIS — R739 Hyperglycemia, unspecified: Secondary | ICD-10-CM

## 2018-02-27 DIAGNOSIS — E039 Hypothyroidism, unspecified: Secondary | ICD-10-CM | POA: Diagnosis present

## 2018-02-27 DIAGNOSIS — Z87891 Personal history of nicotine dependence: Secondary | ICD-10-CM

## 2018-02-27 DIAGNOSIS — J181 Lobar pneumonia, unspecified organism: Secondary | ICD-10-CM | POA: Diagnosis present

## 2018-02-27 DIAGNOSIS — J440 Chronic obstructive pulmonary disease with acute lower respiratory infection: Secondary | ICD-10-CM | POA: Diagnosis present

## 2018-02-27 DIAGNOSIS — F319 Bipolar disorder, unspecified: Secondary | ICD-10-CM | POA: Diagnosis present

## 2018-02-27 DIAGNOSIS — N400 Enlarged prostate without lower urinary tract symptoms: Secondary | ICD-10-CM | POA: Diagnosis present

## 2018-02-27 DIAGNOSIS — Z79899 Other long term (current) drug therapy: Secondary | ICD-10-CM

## 2018-02-27 HISTORY — DX: Hypothyroidism, unspecified: E03.9

## 2018-02-27 HISTORY — DX: Unspecified dementia, unspecified severity, without behavioral disturbance, psychotic disturbance, mood disturbance, and anxiety: F03.90

## 2018-02-27 HISTORY — DX: Chronic obstructive pulmonary disease, unspecified: J44.9

## 2018-02-27 HISTORY — DX: Bipolar disorder, unspecified: F31.9

## 2018-02-27 HISTORY — DX: Hyperlipidemia, unspecified: E78.5

## 2018-02-27 LAB — URINALYSIS, ROUTINE W REFLEX MICROSCOPIC
BACTERIA UA: NONE SEEN
BILIRUBIN URINE: NEGATIVE
Glucose, UA: >=500 mg/dL — AB
Hgb urine dipstick: NEGATIVE
KETONES UR: 20 mg/dL — AB
Leukocytes, UA: NEGATIVE
Nitrite: NEGATIVE
PH: 5 (ref 5.0–8.0)
PROTEIN: NEGATIVE mg/dL
Specific Gravity, Urine: 1.029 (ref 1.005–1.030)

## 2018-02-27 LAB — CBC WITH DIFFERENTIAL/PLATELET
BASOS ABS: 0 10*3/uL (ref 0.0–0.1)
BASOS PCT: 0 %
EOS PCT: 0 %
Eosinophils Absolute: 0 10*3/uL (ref 0.0–0.7)
HEMATOCRIT: 35.8 % — AB (ref 39.0–52.0)
HEMOGLOBIN: 12.2 g/dL — AB (ref 13.0–17.0)
Lymphocytes Relative: 10 %
Lymphs Abs: 1.3 10*3/uL (ref 0.7–4.0)
MCH: 30.6 pg (ref 26.0–34.0)
MCHC: 34.1 g/dL (ref 30.0–36.0)
MCV: 89.7 fL (ref 78.0–100.0)
MONO ABS: 0.7 10*3/uL (ref 0.1–1.0)
Monocytes Relative: 5 %
Neutro Abs: 10.6 10*3/uL — ABNORMAL HIGH (ref 1.7–7.7)
Neutrophils Relative %: 85 %
Platelets: 165 10*3/uL (ref 150–400)
RBC: 3.99 MIL/uL — ABNORMAL LOW (ref 4.22–5.81)
RDW: 14 % (ref 11.5–15.5)
WBC: 12.6 10*3/uL — AB (ref 4.0–10.5)

## 2018-02-27 LAB — BASIC METABOLIC PANEL
ANION GAP: 12 (ref 5–15)
BUN: 21 mg/dL (ref 8–23)
CALCIUM: 8.8 mg/dL — AB (ref 8.9–10.3)
CO2: 19 mmol/L — AB (ref 22–32)
CREATININE: 0.92 mg/dL (ref 0.61–1.24)
Chloride: 103 mmol/L (ref 98–111)
Glucose, Bld: 314 mg/dL — ABNORMAL HIGH (ref 70–99)
Potassium: 4.5 mmol/L (ref 3.5–5.1)
SODIUM: 134 mmol/L — AB (ref 135–145)

## 2018-02-27 LAB — CBG MONITORING, ED: GLUCOSE-CAPILLARY: 300 mg/dL — AB (ref 70–99)

## 2018-02-27 LAB — PROTIME-INR
INR: 1.13
Prothrombin Time: 14.4 seconds (ref 11.4–15.2)

## 2018-02-27 LAB — BRAIN NATRIURETIC PEPTIDE: B NATRIURETIC PEPTIDE 5: 157.7 pg/mL — AB (ref 0.0–100.0)

## 2018-02-27 LAB — TROPONIN I

## 2018-02-27 LAB — GLUCOSE, CAPILLARY: GLUCOSE-CAPILLARY: 335 mg/dL — AB (ref 70–99)

## 2018-02-27 MED ORDER — DONEPEZIL HCL 10 MG PO TABS
10.0000 mg | ORAL_TABLET | Freq: Every day | ORAL | Status: DC
  Administered 2018-02-28 – 2018-03-04 (×5): 10 mg via ORAL
  Filled 2018-02-27 (×5): qty 1

## 2018-02-27 MED ORDER — ALBUTEROL SULFATE (2.5 MG/3ML) 0.083% IN NEBU: 2.5000 mg | INHALATION_SOLUTION | Freq: Four times a day (QID) | RESPIRATORY_TRACT | Status: DC

## 2018-02-27 MED ORDER — METHYLPREDNISOLONE SODIUM SUCC 125 MG IJ SOLR
80.0000 mg | Freq: Three times a day (TID) | INTRAMUSCULAR | Status: DC
  Administered 2018-02-28: 80 mg via INTRAVENOUS
  Filled 2018-02-27: qty 2

## 2018-02-27 MED ORDER — ALBUTEROL SULFATE (2.5 MG/3ML) 0.083% IN NEBU: 2.5000 mg | INHALATION_SOLUTION | Freq: Four times a day (QID) | RESPIRATORY_TRACT | Status: DC | PRN

## 2018-02-27 MED ORDER — TIOTROPIUM BROMIDE MONOHYDRATE 18 MCG IN CAPS
18.0000 ug | ORAL_CAPSULE | Freq: Every day | RESPIRATORY_TRACT | Status: DC
  Administered 2018-02-28 – 2018-03-04 (×5): 18 ug via RESPIRATORY_TRACT
  Filled 2018-02-27: qty 5

## 2018-02-27 MED ORDER — WARFARIN - PHARMACIST DOSING INPATIENT: Freq: Every day | Status: DC

## 2018-02-27 MED ORDER — ALBUTEROL SULFATE (2.5 MG/3ML) 0.083% IN NEBU
2.5000 mg | INHALATION_SOLUTION | Freq: Three times a day (TID) | RESPIRATORY_TRACT | Status: DC
  Administered 2018-02-28: 2.5 mg via RESPIRATORY_TRACT
  Filled 2018-02-27: qty 3

## 2018-02-27 MED ORDER — ATORVASTATIN CALCIUM 40 MG PO TABS
40.0000 mg | ORAL_TABLET | Freq: Every day | ORAL | Status: DC
  Administered 2018-02-28 – 2018-03-04 (×4): 40 mg via ORAL
  Filled 2018-02-27 (×4): qty 1

## 2018-02-27 MED ORDER — ALBUTEROL (5 MG/ML) CONTINUOUS INHALATION SOLN
10.0000 mg/h | INHALATION_SOLUTION | Freq: Once | RESPIRATORY_TRACT | Status: AC
  Administered 2018-02-27: 10 mg/h via RESPIRATORY_TRACT
  Filled 2018-02-27: qty 20

## 2018-02-27 MED ORDER — INSULIN ASPART 100 UNIT/ML ~~LOC~~ SOLN
0.0000 [IU] | Freq: Every day | SUBCUTANEOUS | Status: DC
  Administered 2018-02-28: 4 [IU] via SUBCUTANEOUS

## 2018-02-27 MED ORDER — SODIUM CHLORIDE 0.9 % IV SOLN
500.0000 mg | Freq: Every day | INTRAVENOUS | Status: DC
  Administered 2018-02-28 – 2018-03-02 (×4): 500 mg via INTRAVENOUS
  Filled 2018-02-27 (×4): qty 500

## 2018-02-27 MED ORDER — METHYLPREDNISOLONE SODIUM SUCC 125 MG IJ SOLR
125.0000 mg | Freq: Once | INTRAMUSCULAR | Status: AC
  Administered 2018-02-27: 125 mg via INTRAVENOUS
  Filled 2018-02-27: qty 2

## 2018-02-27 MED ORDER — LEVOTHYROXINE SODIUM 25 MCG PO TABS
25.0000 ug | ORAL_TABLET | Freq: Every day | ORAL | Status: DC
  Administered 2018-02-28 – 2018-03-04 (×5): 25 ug via ORAL
  Filled 2018-02-27 (×5): qty 1

## 2018-02-27 MED ORDER — RISPERIDONE 1 MG PO TABS
2.0000 mg | ORAL_TABLET | Freq: Every day | ORAL | Status: DC
  Administered 2018-02-28 – 2018-03-03 (×5): 2 mg via ORAL
  Filled 2018-02-27 (×5): qty 2

## 2018-02-27 MED ORDER — IPRATROPIUM-ALBUTEROL 0.5-2.5 (3) MG/3ML IN SOLN
3.0000 mL | Freq: Once | RESPIRATORY_TRACT | Status: AC
  Administered 2018-02-27: 3 mL via RESPIRATORY_TRACT
  Filled 2018-02-27: qty 3

## 2018-02-27 MED ORDER — METFORMIN HCL 500 MG PO TABS
500.0000 mg | ORAL_TABLET | Freq: Two times a day (BID) | ORAL | Status: DC
  Administered 2018-02-28 – 2018-03-04 (×10): 500 mg via ORAL
  Filled 2018-02-27 (×10): qty 1

## 2018-02-27 MED ORDER — THIOTHIXENE 5 MG PO CAPS
5.0000 mg | ORAL_CAPSULE | Freq: Every day | ORAL | Status: DC
  Administered 2018-02-28 – 2018-03-03 (×5): 5 mg via ORAL
  Filled 2018-02-27 (×6): qty 1

## 2018-02-27 MED ORDER — TAMSULOSIN HCL 0.4 MG PO CAPS
0.4000 mg | ORAL_CAPSULE | Freq: Every day | ORAL | Status: DC
  Administered 2018-02-28 – 2018-03-03 (×5): 0.4 mg via ORAL
  Filled 2018-02-27 (×5): qty 1

## 2018-02-27 MED ORDER — METOPROLOL TARTRATE 25 MG PO TABS
37.5000 mg | ORAL_TABLET | Freq: Two times a day (BID) | ORAL | Status: DC
  Administered 2018-02-28 – 2018-03-02 (×7): 37.5 mg via ORAL
  Filled 2018-02-27 (×8): qty 2

## 2018-02-27 MED ORDER — ENOXAPARIN SODIUM 40 MG/0.4ML ~~LOC~~ SOLN: 40.0000 mg | Freq: Every day | SUBCUTANEOUS | Status: DC

## 2018-02-27 MED ORDER — ACETAMINOPHEN 325 MG PO TABS: 650.0000 mg | ORAL_TABLET | Freq: Four times a day (QID) | ORAL | Status: DC | PRN

## 2018-02-27 MED ORDER — SODIUM CHLORIDE 0.9 % IV SOLN
INTRAVENOUS | Status: DC
  Administered 2018-02-27 – 2018-03-02 (×5): via INTRAVENOUS

## 2018-02-27 MED ORDER — INSULIN ASPART 100 UNIT/ML ~~LOC~~ SOLN: 0.0000 [IU] | Freq: Three times a day (TID) | SUBCUTANEOUS | Status: DC

## 2018-02-27 MED ORDER — IPRATROPIUM BROMIDE 0.02 % IN SOLN
1.0000 mg | Freq: Once | RESPIRATORY_TRACT | Status: AC
  Administered 2018-02-27: 1 mg via RESPIRATORY_TRACT
  Filled 2018-02-27: qty 5

## 2018-02-27 MED ORDER — ACETAMINOPHEN 650 MG RE SUPP: 650.0000 mg | Freq: Four times a day (QID) | RECTAL | Status: DC | PRN

## 2018-02-27 MED ORDER — WARFARIN SODIUM 10 MG PO TABS
10.0000 mg | ORAL_TABLET | Freq: Every day | ORAL | Status: DC
  Administered 2018-02-28: 10 mg via ORAL
  Filled 2018-02-27: qty 1

## 2018-02-27 MED ORDER — SERTRALINE HCL 100 MG PO TABS
200.0000 mg | ORAL_TABLET | Freq: Every day | ORAL | Status: DC
  Administered 2018-02-28 – 2018-03-04 (×5): 200 mg via ORAL
  Filled 2018-02-27 (×5): qty 2

## 2018-02-27 NOTE — ED Notes (Signed)
EKG given to EDP,Mcmanus,MD., for review. 

## 2018-02-27 NOTE — Progress Notes (Signed)
ANTICOAGULATION CONSULT NOTE - Initial Consult  Pharmacy Consult for warfarin Indication: atrial fibrillation  No Known Allergies  Patient Measurements: Height: 5\' 11"  (180.3 cm) Weight: 230 lb (104.3 kg) IBW/kg (Calculated) : 75.3 Heparin Dosing Weight:   Vital Signs: Temp: 98.1 F (36.7 C) (08/29 1853) Temp Source: Oral (08/29 1853) BP: 144/72 (08/29 1924) Pulse Rate: 75 (08/29 1924)  Labs: Recent Labs    02/27/18 1923  HGB 12.2*  HCT 35.8*  PLT 165  LABPROT 14.4  INR 1.13  CREATININE 0.92  TROPONINI <0.03    Estimated Creatinine Clearance: 95.8 mL/min (by C-G formula based on SCr of 0.92 mg/dL).   Medical History: Past Medical History:  Diagnosis Date  . Anxiety   . Atrial fibrillation (HCC)   . Bipolar disorder (HCC)   . COPD (chronic obstructive pulmonary disease) (HCC)   . Dementia   . Depression   . Diabetes mellitus   . Diabetes mellitus without complication (HCC)   . High cholesterol   . Hyperlipidemia   . Hypothyroidism     Medications:   Assessment: Patient on warfarin prior to admission for afib.  INR on admit 1.13.  Last dose of warfarin noted per med rec on 8/28 at 1800.   Goal of Therapy:  INR 2-3 Monitor platelets by anticoagulation protocol: Yes   Plan:  Warfarin 10mg  po qd, first dose now Daily INR  Aleene DavidsonGrimsley Jr, Goku Harb Crowford 02/27/2018,11:11 PM

## 2018-02-27 NOTE — H&P (Signed)
TRH H&P   Patient Demographics:    [name], is a 67 y.o. male  MRN: [id number]   DOB - 12-12-1950  Admit Date - 02/27/2018  Outpatient Primary MD for the patient is Grace Isaac, MD  Referring MD/NP/PA: [facility]  Outpatient Specialists:   Patient coming from:  home  Chief Complaint  Patient presents with  . Weakness      HPI:    [name]  is a 67 y.o. male, w hypertension, hyperlipidemia, dm2, hypothyroidism, dementia, Pafib, Copd presents with cough, dry.  + dyspnea and wheezing  Denies fever, chills, cp, palp, n/v, diarrhea, brbpr, black stool.   In Ed,  CXR IMPRESSION: Chronic bronchitic change of the lungs.  BNP157.7 Na 134, K4.5, Bun 21, Creatinine 0.92 Troponin <0.03  Wbc 12.6, Hgb 12.2, Plt 165  INR 1.13  EKG nsr at 75, nl axis, early R progression  Pt will be admitted for dyspnea probably related to Copd exacerbation, bronchitis.  Pox 72% on RA  With ambulation.      Review of systems:    In addition to the HPI above,  No Fever-chills, No Headache, No changes with Vision or hearing, No problems swallowing food or Liquids, No Chest pain, no orthopnea, no PND No Abdominal pain, No Nausea or Vommitting, Bowel movements are regular, No Blood in stool or Urine, No dysuria, No new skin rashes or bruises, No new joints pains-aches,  No new weakness, tingling, numbness in any extremity, No recent weight gain or loss, No polyuria, polydypsia or polyphagia, No significant Mental Stressors.  A full 10 point Review of Systems was done, except as stated above, all other Review of Systems were negative.   With Past History of the following :    Past Medical History:  Diagnosis Date  . Anxiety   . Atrial fibrillation (HCC)   . Bipolar disorder (HCC)   . COPD (chronic obstructive pulmonary disease) (HCC)   . Dementia   .  Depression   . Diabetes mellitus   . Diabetes mellitus without complication (HCC)   . High cholesterol   . Hyperlipidemia   . Hypothyroidism       Past Surgical History:  Procedure Laterality Date  . APPENDECTOMY        Social History:     Social History   Tobacco Use  . Smoking status: Former Games developer  . Smokeless tobacco: Never Used  Substance Use Topics  . Alcohol use: No    Comment: former     Lives - at home  Mobility - walks by self   Family History :     Family History  Adopted: Yes      Home Medications:   Prior to Admission medications   Medication Sig Start Date End Date Taking? Authorizing Provider  donepezil (ARICEPT) 10 MG tablet Take 10 mg by mouth daily.   Yes  [provider]  glipiZIDE (GLUCOTROL) 5 MG tablet Take 5 mg by mouth daily.     Yes [provider]  insulin glargine (LANTUS SOLOSTAR) 100 UNIT/ML injection Inject 0.4 mLs (40 Units total) into the skin at bedtime. Inject 40 units daily increasing by 4 units every 3 days until blood glucose level is less than 120mg /dL 1/61/09  Yes Linwood Dibbles, MD  levothyroxine (SYNTHROID, LEVOTHROID) 25 MCG tablet Take 25 mcg by mouth daily before breakfast.   Yes [provider]  metFORMIN (GLUCOPHAGE) 500 MG tablet Take 500 mg by mouth 2 (two) times daily with a meal.    Yes [provider]  metoprolol tartrate (LOPRESSOR) 25 MG tablet Take 1.5 tablets (37.5 mg total) by mouth 2 (two) times daily. 04/06/13  Yes Vann, Jessica U, DO  risperidone (RISPERDAL) 4 MG tablet Take 2 mg by mouth at bedtime.   Yes [provider]  sertraline (ZOLOFT) 100 MG tablet Take 200 mg by mouth daily.   Yes [provider]  simvastatin (ZOCOR) 80 MG tablet Take 80 mg by mouth daily.   Yes [provider]  tamsulosin (FLOMAX) 0.4 MG CAPS capsule Take 0.4 mg by mouth at bedtime.   Yes [provider]  thiothixene (NAVANE) 5 MG capsule Take 5 mg by mouth at  bedtime.     Yes [provider]  warfarin (COUMADIN) 5 MG tablet Take 10 mg by mouth daily at 6 PM.    Yes [provider]     Allergies:    No Known Allergies   Physical Exam:   Vitals  Blood pressure 133/83, pulse (!) 140, temperature 98.7 F (37.1 C), resp. rate 16, height 5\' 11"  (1.803 m), weight 98.3 kg, SpO2 94 %.   1. General  lying in bed in NAD,   2. Normal affect and insight, Not Suicidal or Homicidal, Awake Alert, Oriented X 3.  3. No F.N deficits, ALL C.Nerves Intact, Strength 5/5 all 4 extremities, Sensation intact all 4 extremities, Plantars down going.  4. Ears and Eyes appear Normal, Conjunctivae clear, PERRLA. Moist Oral Mucosa.  5. Supple Neck, No JVD, No cervical lymphadenopathy appriciated, No Carotid Bruits.  6. Symmetrical Chest wall movement, slight tight, bilateral wheezing, no crackles.   7. Irr, irr, s1, s2,  8. Positive Bowel Sounds, Abdomen Soft, No tenderness, No organomegaly appriciated,No rebound -guarding or rigidity.  9.  No Cyanosis, Normal Skin Turgor, No Skin Rash or Bruise.  10. Good muscle tone,  joints appear normal , no effusions, Normal ROM.  11. No Palpable Lymph Nodes in Neck or Axillae     Data Review:    CBC Recent Labs  Lab 02/27/18 1923  WBC 12.6*  HGB 12.2*  HCT 35.8*  PLT 165  MCV 89.7  MCH 30.6  MCHC 34.1  RDW 14.0  LYMPHSABS 1.3  MONOABS 0.7  EOSABS 0.0  BASOSABS 0.0   ------------------------------------------------------------------------------------------------------------------  Chemistries  Recent Labs  Lab 02/27/18 1923  NA 134*  K 4.5  CL 103  CO2 19*  GLUCOSE 314*  BUN 21  CREATININE 0.92  CALCIUM 8.8*   ------------------------------------------------------------------------------------------------------------------ estimated creatinine clearance is 93.1 mL/min (by C-G formula based on SCr of 0.92  mg/dL). ------------------------------------------------------------------------------------------------------------------ No results for input(s): TSH, T4TOTAL, T3FREE, THYROIDAB in the last 72 hours.  Invalid input(s): FREET3  Coagulation profile Recent Labs  Lab 02/27/18 1923  INR 1.13   ------------------------------------------------------------------------------------------------------------------- No results for input(s): DDIMER in the last 72 hours. -------------------------------------------------------------------------------------------------------------------  Cardiac  Enzymes Recent Labs  Lab 02/27/18 1923  TROPONINI <0.03   ------------------------------------------------------------------------------------------------------------------    Component Value Date/Time   BNP 157.7 (H) 02/27/2018 1923     ---------------------------------------------------------------------------------------------------------------  Urinalysis    Component Value Date/Time   COLORURINE YELLOW 02/27/2018 1923   APPEARANCEUR CLEAR 02/27/2018 1923   LABSPEC 1.029 02/27/2018 1923   PHURINE 5.0 02/27/2018 1923   GLUCOSEU >=500 (A) 02/27/2018 1923   HGBUR NEGATIVE 02/27/2018 1923   BILIRUBINUR NEGATIVE 02/27/2018 1923   KETONESUR 20 (A) 02/27/2018 1923   PROTEINUR NEGATIVE 02/27/2018 1923   UROBILINOGEN 1.0 04/05/2013 1551   NITRITE NEGATIVE 02/27/2018 1923   LEUKOCYTESUR NEGATIVE 02/27/2018 1923    ----------------------------------------------------------------------------------------------------------------   Imaging Results:    Dg Chest 2 View  Result Date: 02/27/2018 CLINICAL DATA:  New onset weakness and cough. EXAM: CHEST - 2 VIEW COMPARISON:  05/01/2011 FINDINGS: Stable cardiomegaly with mild aortic atherosclerosis. No acute pulmonary consolidation or CHF. Chronic mild bronchitic change of the lungs with increased interstitial lung markings and peribronchial thickening.  No effusion or pneumothorax. [facility] and glenohumeral joints. Degenerative changes are present along the dorsal spine without significant change. IMPRESSION: Chronic bronchitic change of the lungs. Electronically Signed   By: Tollie Ethavid  Kwon M.D.   On: 02/27/2018 20:02      Assessment & Plan:    Principal Problem:   COPD exacerbation (HCC) Active Problems:   Diabetes (HCC)    Dyspnea, cough secondary to Copd exacerbation Solumedrol 80mg  iv q8h zithromax 500mg  iv qday  spiriva 1puff qday Albuterol neb 1 po q6h and q6h prn   Afib with RVR Tele Trop I q6h x3 D dimer , if positive then CTA chest r/o PE Check cardiac echo Check TSH Metoprolol 5mg  iv x1 Cont metoprolol 37.5mg  po bid Cont coumadin pharmacy to dose Lovenox 1mg / kg [place] bid x2 doses INR in am  Hypothyroidism Check TSH Cont levothyroxine  Dm2 Cont Metformin Cont Lantus 40 units [place] qday DC glipizide fsbs ac and qhs, ISS  Anxiety, Mood disorder Cont Zoloft 200mg  po qday Cont Risperdal 2mg  po qhs Cont Navane Cont Aricept  Bph Cont Flomax  Hyperlipidemia Cont Simvastatin => lipitor 40mg  po qhs   DVT Prophylaxis Coumadin - Lovenox 1mg / kg [place] bid x 3 doses,  SCDs  AM Labs Ordered, also please review Full Orders  Family Communication: Admission, patients condition and plan of care including tests being ordered have been discussed with the patient  who indicate understanding and agree with the plan and Code Status.  Code Status  FULL CODE  Likely DC to  home  Condition GUARDED   Consults called:   none  Admission status: observation.  Pt requires admission to the hospital for iv steroids and also iv metoprolol for afib with RVR .  If not improving rapidly pt might require inpatient admission rather than observation  Time spent in minutes : 70   Pearson GrippeJames Jayshon Dommer M.D on 02/27/2018 at 11:58 PM  Between 7am to 7pm - Pager - [phone]. After 7pm go to [email or web address] - password [email or web address]  [facility] - Office  [phone]

## 2018-02-27 NOTE — ED Notes (Signed)
ED TO INPATIENT HANDOFF REPORT  Name/Age/Gender [name] 67 y.o. male  Code Status Code Status History    Date Active Date Inactive Code Status Order ID Comments User Context   04/05/2013 2149 04/06/2013 2000 Full Code [id number]  Orvan Falconer, MD Inpatient   05/01/2011 1254 05/02/2011 0153 Full Code [id number]  Sharyon Cable, MD ED      Home/SNF/Other Home  Chief Complaint weakness  Level of Care/Admitting Diagnosis ED Disposition    ED Disposition Condition [place]: [facility] [[id number]]  Level of Care: Telemetry [5]  Admit to tele based on following criteria: Monitor for Ischemic changes  Diagnosis: COPD exacerbation [facility]) [[id number]]  Admitting Physician: Jani Gravel [phone]  Attending Physician: Jani Gravel [[id number]]  PT Class (Do Not Modify): Observation [104]  PT Acc Code (Do Not Modify): Observation [10022]       Medical History Past Medical History:  Diagnosis Date  . Anxiety   . Atrial fibrillation ([place])   . Bipolar disorder ([place])   . COPD (chronic obstructive pulmonary disease) ([place])   . Dementia   . Depression   . Diabetes mellitus   . Diabetes mellitus without complication ([place])   . High cholesterol   . Hyperlipidemia   . Hypothyroidism     Allergies No Known Allergies  IV Location/Drains/Wounds Patient Lines/Drains/Airways Status   Active Line/Drains/Airways    Name:   Placement date:   Placement time:   Site:   Days:   Peripheral IV 02/27/18 Left Antecubital   02/27/18    1851    Antecubital   less than 1          Labs/Imaging Results for orders placed or performed during the hospital encounter of 02/27/18 (from the past 48 hour(s))  CBG monitoring, ED     Status: Abnormal   Collection Time: 02/27/18  7:16 PM  Result Value Ref Range   Glucose-Capillary 300 (H) 70 - 99 mg/dL   Comment 1 Notify RN   Urinalysis, Routine w reflex microscopic     Status: Abnormal   Collection Time: 02/27/18  7:23 PM   Result Value Ref Range   Color, Urine YELLOW YELLOW   APPearance CLEAR CLEAR   Specific Gravity, Urine 1.029 1.005 - 1.030   pH 5.0 5.0 - 8.0   Glucose, UA >=500 (A) NEGATIVE mg/dL   Hgb urine dipstick NEGATIVE NEGATIVE   Bilirubin Urine NEGATIVE NEGATIVE   Ketones, ur 20 (A) NEGATIVE mg/dL   Protein, ur NEGATIVE NEGATIVE mg/dL   Nitrite NEGATIVE NEGATIVE   Leukocytes, UA NEGATIVE NEGATIVE   RBC / HPF 0-5 0 - 5 RBC/hpf   WBC, UA 0-5 0 - 5 WBC/hpf   Bacteria, UA NONE SEEN NONE SEEN   Squamous Epithelial / LPF 0-5 0 - 5   Mucus PRESENT     Comment: Performed at [facility], [place] [address], [place], [place] [address]  Basic metabolic panel     Status: Abnormal   Collection Time: 02/27/18  7:23 PM  Result Value Ref Range   Sodium 134 (L) 135 - 145 mmol/L   Potassium 4.5 3.5 - 5.1 mmol/L   Chloride 103 98 - 111 mmol/L   CO2 19 (L) 22 - 32 mmol/L   Glucose, Bld 314 (H) 70 - 99 mg/dL   BUN 21 8 - 23 mg/dL   Creatinine, Ser 0.92 0.61 - 1.24 mg/dL   Calcium 8.8 (L) 8.9 - 10.3 mg/dL  GFR calc non Af Amer >60 >60 mL/min   GFR calc Af Amer >60 >60 mL/min    Comment: (NOTE) The eGFR has been calculated using the CKD EPI equation. This calculation has not been validated in all clinical situations. eGFR's persistently <60 mL/min signify possible Chronic Kidney Disease.    Anion gap 12 5 - 15    Comment: Performed at [facility], [place] [address], [place], [place] [address]  Brain natriuretic peptide     Status: Abnormal   Collection Time: 02/27/18  7:23 PM  Result Value Ref Range   B Natriuretic Peptide 157.7 (H) 0.0 - 100.0 pg/mL    Comment: Performed at [facility], [place] [address] [place], [place] [address]  Troponin I     Status: None   Collection Time: 02/27/18  7:23 PM  Result Value Ref Range   Troponin I <0.03 <0.03 ng/mL    Comment: Performed at [facility], [place] [address], [place], [place]  [address]  CBC with Differential     Status: Abnormal   Collection Time: 02/27/18  7:23 PM  Result Value Ref Range   WBC 12.6 (H) 4.0 - 10.5 K/uL   RBC 3.99 (L) 4.22 - 5.81 MIL/uL   Hemoglobin 12.2 (L) 13.0 - 17.0 g/dL   HCT 35.8 (L) 39.0 - 52.0 %   MCV 89.7 78.0 - 100.0 fL   MCH 30.6 26.0 - 34.0 pg   MCHC 34.1 30.0 - 36.0 g/dL   RDW 14.0 11.5 - 15.5 %   Platelets 165 150 - 400 K/uL   Neutrophils Relative % 85 %   Neutro Abs 10.6 (H) 1.7 - 7.7 K/uL   Lymphocytes Relative 10 %   Lymphs Abs 1.3 0.7 - 4.0 K/uL   Monocytes Relative 5 %   Monocytes Absolute 0.7 0.1 - 1.0 K/uL   Eosinophils Relative 0 %   Eosinophils Absolute 0.0 0.0 - 0.7 K/uL   Basophils Relative 0 %   Basophils Absolute 0.0 0.0 - 0.1 K/uL    Comment: Performed at [facility], [place] [address], [place], [place] [address]  Protime-INR     Status: None   Collection Time: 02/27/18  7:23 PM  Result Value Ref Range   Prothrombin Time 14.4 11.4 - 15.2 seconds   INR 1.13     Comment: Performed at [facility], [place] [address] [place], [place] [address]   Dg Chest 2 View  Result Date: 02/27/2018 CLINICAL DATA:  New onset weakness and cough. EXAM: CHEST - 2 VIEW COMPARISON:  05/01/2011 FINDINGS: Stable cardiomegaly with mild aortic atherosclerosis. No acute pulmonary consolidation or CHF. Chronic mild bronchitic change of the lungs with increased interstitial lung markings and peribronchial thickening. No effusion or pneumothorax. Osteoarthritis of the [facility] and glenohumeral joints. Degenerative changes are present along the dorsal spine without significant change. IMPRESSION: Chronic bronchitic change of the lungs. Electronically Signed   By: Ashley Royalty M.D.   On: 02/27/2018 20:02    Pending Labs Unresulted Labs (From admission, onward)    Start     Ordered   02/27/18 1909  Urine culture  STAT,   STAT     02/27/18 1908          Vitals/Pain Today's Vitals   02/27/18 1856 02/27/18 1924  02/27/18 2034 02/27/18 2227  BP:  (!) 144/72    Pulse:  75    Resp:  19    Temp:  TempSrc:      SpO2:  93% 93% 99%  Weight: 104.3 kg     Height: 5' 11"  (1.803 m)       Isolation Precautions No active isolations  Medications Medications  ipratropium-albuterol (DUONEB) 0.5-2.5 (3) MG/3ML nebulizer solution 3 mL (3 mLs Nebulization Given 02/27/18 2034)  methylPREDNISolone sodium succinate (SOLU-MEDROL) 125 mg/2 mL injection 125 mg (125 mg Intravenous Given 02/27/18 2221)  albuterol (PROVENTIL,VENTOLIN) solution continuous neb (10 mg/hr Nebulization Given 02/27/18 2227)  ipratropium (ATROVENT) nebulizer solution 1 mg (1 mg Nebulization Given 02/27/18 2227)    Mobility walks with device

## 2018-02-27 NOTE — ED Notes (Signed)
Bed: [id number] Expected date:  Expected time:  Means of arrival:  Comments: EMS 67yo cough, dizziness, weakness

## 2018-02-27 NOTE — ED Notes (Addendum)
Pt. Ambulated down the hall on room air - 72% O2 sat. Unstable on his feet with 2 assist and pt. Redirected back to his room. Pt. Put on 2 liters 02 per RN,Robin.

## 2018-02-27 NOTE — ED Provider Notes (Signed)
[facility]-EMERGENCY DEPT Provider Note   CSN: [id number] Arrival date & time: 02/27/18  [id number]     History   Chief Complaint Chief Complaint  Patient presents with  . Weakness    HPI Laurence Slatehilip S Schueller is a 67 y.o. male.  The history is provided by the patient and the EMS personnel. The history is limited by the condition of the patient (Hx dementia).  Weakness   Pt was seen at 1905.  Per EMS, Home Health care staff and pt: EMS called for pt c/o generalized weakness and cough for unknown period of time. Home Health staff reported pt has had "difficulty walking around," "SOB," and "diaphoresis." EMS states pt's O2 Sat was "93% R/A." EMS placed pt on O2 N/C with increasing Sats. Pt also has been without his insulin for 2 weeks due to "insurance." Pt himself denies CP, no abd pain, no N/V/D, no back pain, no focal motor weakness.    Past Medical History:  Diagnosis Date  . Anxiety   . Atrial fibrillation (H[facility])   . Bipolar disorder ([facility])   . COPD (chronic obstructive pulmonary disease) (H[facility])   . Dementia   . Depression   . Diabetes mellitus   . Diabetes mellitus without complication (H[facility])   . High cholesterol   . Hyperlipidemia   . Hypothyroidism     Patient Active Problem List   Diagnosis Date Noted  . Atrial fibrillation (H[facility]) 04/05/2013  . Diarrhea 04/05/2013  . Orthostatic hypotension 04/05/2013  . Dehydration 04/05/2013  . Diabetes (H[facility]) 04/05/2013    Past Surgical History:  Procedure Laterality Date  . APPENDECTOMY          Home Medications    Prior to Admission medications   Medication Sig Start Date End Date Taking? Authorizing Provider  aspirin EC 81 MG tablet Take 81 mg by mouth daily.    [provider]  donepezil (ARICEPT) 10 MG tablet Take 10 mg by mouth daily.    [provider]  glipiZIDE (GLUCOTROL) 5 MG tablet Take 5 mg by mouth daily.      [provider]  insulin glargine (LANTUS SOLOSTAR) 100  UNIT/ML injection Inject 0.4 mLs (40 Units total) into the skin at bedtime. Inject 40 units daily increasing by 4 units every 3 days until blood glucose level is less than 120mg /dL 7/84/693/28/14   Linwood DibblesKnapp, Jon, MD  Insulin Glargine (LANTUS SOLOSTAR) 100 UNIT/ML SOPN Inject 40 Units into the skin daily. Increase by 4 units every 3 days until CBG < 130    [provider]  l-methylfolate-B6-B12 (METANX) 3-35-2 MG TABS Take 1 tablet by mouth daily.      [provider]  levothyroxine (SYNTHROID, LEVOTHROID) 25 MCG tablet Take 25 mcg by mouth daily before breakfast.    [provider]  metFORMIN (GLUCOPHAGE) 500 MG tablet Take 500 mg by mouth 3 (three) times daily.      [provider]  metFORMIN (GLUCOPHAGE) 500 MG tablet Take 500 mg by mouth daily.    [provider]  metoprolol tartrate (LOPRESSOR) 25 MG tablet Take 1.5 tablets (37.5 mg total) by mouth 2 (two) times daily. 04/06/13   Joseph ArtVann, Jessica U, DO  omeprazole (PRILOSEC) 20 MG capsule Take 20 mg by mouth daily.    [provider]  risperidone (RISPERDAL) 4 MG tablet Take 2 mg by mouth at bedtime.    [provider]  sertraline (ZOLOFT) 100 MG tablet Take 200 mg by mouth daily.  [provider]  simvastatin (ZOCOR) 40 MG tablet Take 40 mg by mouth at bedtime.      [provider]  simvastatin (ZOCOR) 80 MG tablet Take 80 mg by mouth daily.    [provider]  tamsulosin (FLOMAX) 0.4 MG CAPS capsule Take 0.4 mg by mouth at bedtime.    [provider]  thiothixene (NAVANE) 5 MG capsule Take 5 mg by mouth at bedtime.      [provider]  warfarin (COUMADIN) 5 MG tablet Take 12.5-15 mg by mouth daily. Take 2.5 tabs everyday except take 3 tabs on Monday    [provider]    Family History History reviewed. No pertinent family history.  Social History Social History   Tobacco Use  . Smoking status: Former Smoker  Substance Use Topics    . Alcohol use: No    Comment: former  . Drug use: No     Allergies   Patient has no known allergies.   Review of Systems Review of Systems  Unable to perform ROS: Dementia  Neurological: Positive for weakness.     Physical Exam Updated Vital Signs BP 91/70   Pulse 83   Temp 98.1 F (36.7 C) (Oral)   Resp 19   Ht 5\' 11"  (1.803 m)   Wt 104.3 kg   SpO2 97%   BMI 32.08 kg/m    Patient Vitals for the past 24 hrs:  BP Temp Temp src Pulse Resp SpO2 Height Weight  02/27/18 2034 - - - - - 93 % - -  02/27/18 1924 (!) 144/72 - - 75 19 93 % - -  02/27/18 1856 - - - - - - 5\' 11"  (1.803 m) 104.3 kg  02/27/18 1854 91/70 - - 83 19 97 % - -  02/27/18 1853 103/77 98.1 F (36.7 C) Oral 81 20 96 % - -  02/27/18 1849 - - - - - 95 % - -      19:25 Orthostatic Vital Signs CS  Orthostatic Lying   BP- Lying: 138/71   Pulse- Lying: 76       Orthostatic Sitting  BP- Sitting: 144/72   Pulse- Sitting: 80       Orthostatic Standing at 0 minutes  BP- Standing at 0 minutes: 125/65   Pulse- Standing at 0 minutes: 90      Physical Exam 1910: Physical examination:  Nursing notes reviewed; Vital signs and O2 SAT reviewed;  Constitutional: Well developed, Well nourished, Well hydrated, In no acute distress; Head:  Normocephalic, atraumatic; Eyes: EOMI, PERRL, No scleral icterus; ENMT: Mouth and pharynx normal, Mucous membranes moist; Neck: Supple, Full range of motion, No lymphadenopathy; Cardiovascular: Regular rate and rhythm, No gallop; Respiratory: Breath sounds diminished & equal bilaterally, No wheezes. Speaking full sentences with ease, Normal respiratory effort/excursion; Chest: Nontender, Movement normal; Abdomen: Soft, Nontender, Nondistended, Normal bowel sounds; Genitourinary: No CVA tenderness; Extremities: Peripheral pulses normal, No tenderness, No edema, No calf edema or asymmetry.; Neuro: Awake, alert, confused per hx dementia. No facial droop. Major CN grossly intact.   Speech clear. No gross focal motor deficits in extremities.; Skin: Color normal, Warm, Dry.   ED Treatments / Results  Labs (all labs ordered are listed, but only abnormal results are displayed)   EKG EKG Interpretation  Date/Time:  Thursday February 27 2018 19:18:44 EDT Ventricular Rate:  76 PR Interval:    QRS Duration: 91 QT Interval:  412 QTC Calculation: 464 R Axis:   57 Text  Interpretation:  Sinus rhythm Low voltage, precordial leads Baseline wander When compared with ECG of 05/01/2011 Nonspecific T wave abnormality Inferior leads is no longer Present Confirmed by Samuel Jester [phone]) on 02/27/2018 7:24:20 PM   Radiology   Procedures Procedures (including critical care time)  Medications Ordered in ED Medications  ipratropium-albuterol (DUONEB) 0.5-2.5 (3) MG/3ML nebulizer solution 3 mL (has no administration in time range)     Initial Impression / Assessment and Plan / ED Course  I have reviewed the triage vital signs and the nursing notes.  Pertinent labs & imaging results that were available during my care of the patient were reviewed by me and considered in my medical decision making (see chart for details).  MDM Reviewed: previous chart, nursing note and vitals Reviewed previous: labs and ECG Interpretation: labs, ECG and x-ray Total time providing critical care: 30-74 minutes. This excludes time spent performing separately reportable procedures and services. Consults: admitting MD   CRITICAL CARE Performed by: Samuel Jester Total critical care time: 35 minutes Critical care time was exclusive of separately billable procedures and treating other patients. Critical care was necessary to treat or prevent imminent or life-threatening deterioration. Critical care was time spent personally by me on the following activities: development of treatment plan with patient and/or surrogate as well as nursing, discussions with consultants, evaluation of patient's  response to treatment, examination of patient, obtaining history from patient or surrogate, ordering and performing treatments and interventions, ordering and review of laboratory studies, ordering and review of radiographic studies, pulse oximetry and re-evaluation of patient's condition.   Results for orders placed or performed during the hospital encounter of 02/27/18  Urinalysis, Routine w reflex microscopic  Result Value Ref Range   Color, Urine YELLOW YELLOW   APPearance CLEAR CLEAR   Specific Gravity, Urine 1.029 1.005 - 1.030   pH 5.0 5.0 - 8.0   Glucose, UA >=500 (A) NEGATIVE mg/dL   Hgb urine dipstick NEGATIVE NEGATIVE   Bilirubin Urine NEGATIVE NEGATIVE   Ketones, ur 20 (A) NEGATIVE mg/dL   Protein, ur NEGATIVE NEGATIVE mg/dL   Nitrite NEGATIVE NEGATIVE   Leukocytes, UA NEGATIVE NEGATIVE   RBC / HPF 0-5 0 - 5 RBC/hpf   WBC, UA 0-5 0 - 5 WBC/hpf   Bacteria, UA NONE SEEN NONE SEEN   Squamous Epithelial / LPF 0-5 0 - 5   Mucus PRESENT   Basic metabolic panel  Result Value Ref Range   Sodium 134 (L) 135 - 145 mmol/L   Potassium 4.5 3.5 - 5.1 mmol/L   Chloride 103 98 - 111 mmol/L   CO2 19 (L) 22 - 32 mmol/L   Glucose, Bld 314 (H) 70 - 99 mg/dL   BUN 21 8 - 23 mg/dL   Creatinine, Ser 7.82 0.61 - 1.24 mg/dL   Calcium 8.8 (L) 8.9 - 10.3 mg/dL   GFR calc non Af Amer >60 >60 mL/min   GFR calc Af Amer >60 >60 mL/min   Anion gap 12 5 - 15  Brain natriuretic peptide  Result Value Ref Range   B Natriuretic Peptide 157.7 (H) 0.0 - 100.0 pg/mL  Troponin I  Result Value Ref Range   Troponin I <0.03 <0.03 ng/mL  CBC with Differential  Result Value Ref Range   WBC 12.6 (H) 4.0 - 10.5 K/uL   RBC 3.99 (L) 4.22 - 5.81 MIL/uL   Hemoglobin 12.2 (L) 13.0 - 17.0 g/dL   HCT 95.6 (L) 21.3 - 08.6 %   MCV 89.7  78.0 - 100.0 fL   MCH 30.6 26.0 - 34.0 pg   MCHC 34.1 30.0 - 36.0 g/dL   RDW 16.1 09.6 - 04.5 %   Platelets 165 150 - 400 K/uL   Neutrophils Relative % 85 %   Neutro Abs 10.6  (H) 1.7 - 7.7 K/uL   Lymphocytes Relative 10 %   Lymphs Abs 1.3 0.7 - 4.0 K/uL   Monocytes Relative 5 %   Monocytes Absolute 0.7 0.1 - 1.0 K/uL   Eosinophils Relative 0 %   Eosinophils Absolute 0.0 0.0 - 0.7 K/uL   Basophils Relative 0 %   Basophils Absolute 0.0 0.0 - 0.1 K/uL  Protime-INR  Result Value Ref Range   Prothrombin Time 14.4 11.4 - 15.2 seconds   INR 1.13   CBG monitoring, ED  Result Value Ref Range   Glucose-Capillary 300 (H) 70 - 99 mg/dL   Comment 1 Notify RN    Dg Chest 2 View Result Date: 02/27/2018 CLINICAL DATA:  New onset weakness and cough. EXAM: CHEST - 2 VIEW COMPARISON:  05/01/2011 FINDINGS: Stable cardiomegaly with mild aortic atherosclerosis. No acute pulmonary consolidation or CHF. Chronic mild bronchitic change of the lungs with increased interstitial lung markings and peribronchial thickening. No effusion or pneumothorax. Osteoarthritis of the [facility] and glenohumeral joints. Degenerative changes are present along the dorsal spine without significant change. IMPRESSION: Chronic bronchitic change of the lungs. Electronically Signed   By: Tollie Eth M.D.   On: 02/27/2018 20:02    2240:  Short neb given on arrival to diminished lung sounds with improvement. O2 removed (pt does not wear O2 at home) and pt ambulated. During ambulation, pt's O2 Sats dropped to 72% R/A, pt c/o increasing SOB and he became unsteady walking. 2 person assisted redirecting pt back to his exam room. O2 N/C 3L applied with O2 Sats increasing to 93-94%. IV solumedrol and hour long neb ordered. Dx and testing d/w pt.  Questions answered.  Verb understanding, agreeable to admit.  T/C returned from [facility]. Selena Batten, case discussed, including:  HPI, pertinent PM/SHx, VS/PE, dx testing, ED course and treatment:  Agreeable to admit.     Final Clinical Impressions(s) / ED Diagnoses   Final diagnoses:  None    ED Discharge Orders    None       Samuel Jester, DO 02/28/18 2332

## 2018-02-27 NOTE — ED Triage Notes (Signed)
Per EMS: Home health care called agent due to new onset weakness and cough.  Agent states that every time this year he gets a cold.  She states difficulty walking around, diaphoresis, SOB.  O2 was 93% RA.  Pt placed on 2L [place].  Pt hx alzheimers and DM.  Pt w/o insulin for 2 weeks due to insurance.

## 2018-02-28 ENCOUNTER — Other Ambulatory Visit: Payer: Self-pay

## 2018-02-28 ENCOUNTER — Inpatient Hospital Stay (HOSPITAL_COMMUNITY): Payer: Medicare Other

## 2018-02-28 ENCOUNTER — Observation Stay (HOSPITAL_COMMUNITY): Payer: Medicare Other

## 2018-02-28 DIAGNOSIS — N400 Enlarged prostate without lower urinary tract symptoms: Secondary | ICD-10-CM | POA: Diagnosis present

## 2018-02-28 DIAGNOSIS — T380X5A Adverse effect of glucocorticoids and synthetic analogues, initial encounter: Secondary | ICD-10-CM | POA: Diagnosis present

## 2018-02-28 DIAGNOSIS — I4891 Unspecified atrial fibrillation: Secondary | ICD-10-CM | POA: Diagnosis present

## 2018-02-28 DIAGNOSIS — I340 Nonrheumatic mitral (valve) insufficiency: Secondary | ICD-10-CM

## 2018-02-28 DIAGNOSIS — E1165 Type 2 diabetes mellitus with hyperglycemia: Secondary | ICD-10-CM | POA: Diagnosis present

## 2018-02-28 DIAGNOSIS — F319 Bipolar disorder, unspecified: Secondary | ICD-10-CM | POA: Diagnosis present

## 2018-02-28 DIAGNOSIS — J189 Pneumonia, unspecified organism: Secondary | ICD-10-CM | POA: Diagnosis not present

## 2018-02-28 DIAGNOSIS — J441 Chronic obstructive pulmonary disease with (acute) exacerbation: Secondary | ICD-10-CM | POA: Diagnosis present

## 2018-02-28 DIAGNOSIS — F419 Anxiety disorder, unspecified: Secondary | ICD-10-CM | POA: Diagnosis present

## 2018-02-28 DIAGNOSIS — Z7901 Long term (current) use of anticoagulants: Secondary | ICD-10-CM | POA: Diagnosis not present

## 2018-02-28 DIAGNOSIS — Z79899 Other long term (current) drug therapy: Secondary | ICD-10-CM | POA: Diagnosis not present

## 2018-02-28 DIAGNOSIS — R739 Hyperglycemia, unspecified: Secondary | ICD-10-CM

## 2018-02-28 DIAGNOSIS — Z87891 Personal history of nicotine dependence: Secondary | ICD-10-CM | POA: Diagnosis not present

## 2018-02-28 DIAGNOSIS — Z794 Long term (current) use of insulin: Secondary | ICD-10-CM | POA: Diagnosis not present

## 2018-02-28 DIAGNOSIS — I10 Essential (primary) hypertension: Secondary | ICD-10-CM | POA: Diagnosis present

## 2018-02-28 DIAGNOSIS — J440 Chronic obstructive pulmonary disease with acute lower respiratory infection: Secondary | ICD-10-CM | POA: Diagnosis present

## 2018-02-28 DIAGNOSIS — E785 Hyperlipidemia, unspecified: Secondary | ICD-10-CM | POA: Diagnosis present

## 2018-02-28 DIAGNOSIS — J181 Lobar pneumonia, unspecified organism: Secondary | ICD-10-CM | POA: Diagnosis present

## 2018-02-28 DIAGNOSIS — R0902 Hypoxemia: Secondary | ICD-10-CM | POA: Diagnosis present

## 2018-02-28 DIAGNOSIS — E039 Hypothyroidism, unspecified: Secondary | ICD-10-CM | POA: Diagnosis present

## 2018-02-28 DIAGNOSIS — F0390 Unspecified dementia without behavioral disturbance: Secondary | ICD-10-CM | POA: Diagnosis present

## 2018-02-28 DIAGNOSIS — Z7982 Long term (current) use of aspirin: Secondary | ICD-10-CM | POA: Diagnosis not present

## 2018-02-28 LAB — COMPREHENSIVE METABOLIC PANEL
ALT: 18 U/L (ref 0–44)
AST: 16 U/L (ref 15–41)
Albumin: 2.7 g/dL — ABNORMAL LOW (ref 3.5–5.0)
Alkaline Phosphatase: 70 U/L (ref 38–126)
Anion gap: 7 (ref 5–15)
BUN: 21 mg/dL (ref 8–23)
CO2: 22 mmol/L (ref 22–32)
CREATININE: 0.77 mg/dL (ref 0.61–1.24)
Calcium: 8.9 mg/dL (ref 8.9–10.3)
Chloride: 107 mmol/L (ref 98–111)
Glucose, Bld: 468 mg/dL — ABNORMAL HIGH (ref 70–99)
Potassium: 4.6 mmol/L (ref 3.5–5.1)
Sodium: 136 mmol/L (ref 135–145)
Total Bilirubin: 1 mg/dL (ref 0.3–1.2)
Total Protein: 6.3 g/dL — ABNORMAL LOW (ref 6.5–8.1)

## 2018-02-28 LAB — ECHOCARDIOGRAM COMPLETE
Height: 71 in
Weight: 3442.7 oz

## 2018-02-28 LAB — BASIC METABOLIC PANEL
ANION GAP: 9 (ref 5–15)
BUN: 24 mg/dL — ABNORMAL HIGH (ref 8–23)
CALCIUM: 8.9 mg/dL (ref 8.9–10.3)
CO2: 20 mmol/L — AB (ref 22–32)
Chloride: 108 mmol/L (ref 98–111)
Creatinine, Ser: 0.85 mg/dL (ref 0.61–1.24)
GFR calc Af Amer: >60 mL/min (ref >60)
GFR calc non Af Amer: >60 mL/min (ref >60)
GLUCOSE: 541 mg/dL — AB (ref 70–99)
Potassium: 4.5 mmol/L (ref 3.5–5.1)
Sodium: 137 mmol/L (ref 135–145)

## 2018-02-28 LAB — CBC
HCT: 36 % — ABNORMAL LOW (ref 39.0–52.0)
Hemoglobin: 12.1 g/dL — ABNORMAL LOW (ref 13.0–17.0)
MCH: 30.6 pg (ref 26.0–34.0)
MCHC: 33.6 g/dL (ref 30.0–36.0)
MCV: 90.9 fL (ref 78.0–100.0)
PLATELETS: 153 10*3/uL (ref 150–400)
RBC: 3.96 MIL/uL — AB (ref 4.22–5.81)
RDW: 14.4 % (ref 11.5–15.5)
WBC: 12.4 10*3/uL — ABNORMAL HIGH (ref 4.0–10.5)

## 2018-02-28 LAB — GLUCOSE, CAPILLARY
GLUCOSE-CAPILLARY: 431 mg/dL — AB (ref 70–99)
GLUCOSE-CAPILLARY: 513 mg/dL — AB (ref 70–99)
GLUCOSE-CAPILLARY: 326 mg/dL — AB (ref 70–99)
Glucose-Capillary: 537 mg/dL (ref 70–99)
Glucose-Capillary: 304 mg/dL — ABNORMAL HIGH (ref 70–99)
Glucose-Capillary: 212 mg/dL — ABNORMAL HIGH (ref 70–99)
Glucose-Capillary: 131 mg/dL — ABNORMAL HIGH (ref 70–99)

## 2018-02-28 LAB — TROPONIN I
Troponin I: <0.03 ng/mL (ref <0.03)
Troponin I: <0.03 ng/mL (ref <0.03)

## 2018-02-28 LAB — HIV ANTIBODY (ROUTINE TESTING W REFLEX): HIV Screen 4th Generation wRfx: NONREACTIVE

## 2018-02-28 LAB — D-DIMER, QUANTITATIVE: D-Dimer, Quant: 1.07 ug/mL-FEU — ABNORMAL HIGH (ref 0.00–0.50)

## 2018-02-28 LAB — PROTIME-INR
INR: 1.26
PROTHROMBIN TIME: 15.6 s — AB (ref 11.4–15.2)

## 2018-02-28 LAB — TSH: TSH: 1.242 u[IU]/mL (ref 0.350–4.500)

## 2018-02-28 LAB — HEMOGLOBIN A1C
HEMOGLOBIN A1C: 10.8 % — AB (ref 4.8–5.6)
Mean Plasma Glucose: 263.26 mg/dL

## 2018-02-28 MED ORDER — WARFARIN SODIUM 5 MG PO TABS
12.5000 mg | ORAL_TABLET | Freq: Once | ORAL | Status: AC
  Administered 2018-02-28: 12.5 mg via ORAL
  Filled 2018-02-28: qty 1

## 2018-02-28 MED ORDER — INSULIN ASPART 100 UNIT/ML ~~LOC~~ SOLN
10.0000 [IU] | Freq: Three times a day (TID) | SUBCUTANEOUS | Status: DC
  Administered 2018-02-28 – 2018-03-04 (×12): 10 [IU] via SUBCUTANEOUS

## 2018-02-28 MED ORDER — IOPAMIDOL (ISOVUE-370) INJECTION 76%
INTRAVENOUS | Status: AC
  Filled 2018-02-28: qty 100

## 2018-02-28 MED ORDER — ENOXAPARIN SODIUM 100 MG/ML ~~LOC~~ SOLN
100.0000 mg | Freq: Two times a day (BID) | SUBCUTANEOUS | Status: DC
  Administered 2018-02-28: 100 mg via SUBCUTANEOUS
  Filled 2018-02-28: qty 1

## 2018-02-28 MED ORDER — IOPAMIDOL (ISOVUE-370) INJECTION 76%
100.0000 mL | Freq: Once | INTRAVENOUS | Status: AC | PRN
  Administered 2018-02-28: 100 mL via INTRAVENOUS

## 2018-02-28 MED ORDER — METHYLPREDNISOLONE SODIUM SUCC 125 MG IJ SOLR
60.0000 mg | Freq: Three times a day (TID) | INTRAMUSCULAR | Status: DC
  Administered 2018-02-28 – 2018-03-01 (×3): 60 mg via INTRAVENOUS
  Filled 2018-02-28 (×3): qty 2

## 2018-02-28 MED ORDER — INSULIN ASPART 100 UNIT/ML ~~LOC~~ SOLN
0.0000 [IU] | Freq: Three times a day (TID) | SUBCUTANEOUS | Status: DC
  Administered 2018-02-28: 15 [IU] via SUBCUTANEOUS
  Administered 2018-02-28 (×2): 20 [IU] via SUBCUTANEOUS
  Administered 2018-03-01 (×2): 11 [IU] via SUBCUTANEOUS
  Administered 2018-03-01: 3 [IU] via SUBCUTANEOUS
  Administered 2018-03-02: 7 [IU] via SUBCUTANEOUS
  Administered 2018-03-02: 15 [IU] via SUBCUTANEOUS
  Administered 2018-03-02 – 2018-03-03 (×2): 7 [IU] via SUBCUTANEOUS
  Administered 2018-03-03: 15 [IU] via SUBCUTANEOUS
  Administered 2018-03-04: 11 [IU] via SUBCUTANEOUS
  Administered 2018-03-04: 7 [IU] via SUBCUTANEOUS

## 2018-02-28 MED ORDER — ALBUTEROL SULFATE (2.5 MG/3ML) 0.083% IN NEBU
2.5000 mg | INHALATION_SOLUTION | Freq: Two times a day (BID) | RESPIRATORY_TRACT | Status: DC
  Administered 2018-02-28 – 2018-03-04 (×8): 2.5 mg via RESPIRATORY_TRACT
  Filled 2018-02-28 (×8): qty 3

## 2018-02-28 MED ORDER — METOPROLOL TARTRATE 5 MG/5ML IV SOLN
INTRAVENOUS | Status: AC
  Filled 2018-02-28: qty 5

## 2018-02-28 MED ORDER — INSULIN ASPART 100 UNIT/ML ~~LOC~~ SOLN: 0.0000 [IU] | Freq: Every day | SUBCUTANEOUS | Status: DC

## 2018-02-28 MED ORDER — ENOXAPARIN SODIUM 100 MG/ML ~~LOC~~ SOLN
100.0000 mg | Freq: Two times a day (BID) | SUBCUTANEOUS | Status: DC
  Administered 2018-02-28 – 2018-03-02 (×5): 100 mg via SUBCUTANEOUS
  Filled 2018-02-28 (×5): qty 1

## 2018-02-28 MED ORDER — SODIUM CHLORIDE 0.9 % IV SOLN
1.0000 g | INTRAVENOUS | Status: DC
  Administered 2018-02-28 – 2018-03-03 (×4): 1 g via INTRAVENOUS
  Filled 2018-02-28: qty 10
  Filled 2018-02-28 (×2): qty 1
  Filled 2018-02-28: qty 10
  Filled 2018-02-28 (×2): qty 1

## 2018-02-28 MED ORDER — METOPROLOL TARTRATE 5 MG/5ML IV SOLN
5.0000 mg | Freq: Once | INTRAVENOUS | Status: AC
  Administered 2018-02-28: 5 mg via INTRAVENOUS
  Filled 2018-02-28: qty 5

## 2018-02-28 MED ORDER — GUAIFENESIN-DM 100-10 MG/5ML PO SYRP
15.0000 mL | ORAL_SOLUTION | ORAL | Status: DC | PRN
  Administered 2018-02-28: 15 mL via ORAL
  Filled 2018-02-28: qty 20

## 2018-02-28 MED ORDER — INSULIN ASPART 100 UNIT/ML ~~LOC~~ SOLN
25.0000 [IU] | Freq: Once | SUBCUTANEOUS | Status: AC
  Administered 2018-02-28: 25 [IU] via SUBCUTANEOUS

## 2018-02-28 MED ORDER — METOPROLOL TARTRATE 5 MG/5ML IV SOLN
5.0000 mg | Freq: Once | INTRAVENOUS | Status: AC
  Administered 2018-02-28: 5 mg via INTRAVENOUS

## 2018-02-28 MED ORDER — INSULIN GLARGINE 100 UNIT/ML ~~LOC~~ SOLN
40.0000 [IU] | Freq: Every day | SUBCUTANEOUS | Status: DC
  Administered 2018-02-28: 40 [IU] via SUBCUTANEOUS
  Filled 2018-02-28 (×2): qty 0.4

## 2018-02-28 MED ORDER — INSULIN GLARGINE 100 UNIT/ML ~~LOC~~ SOLN
60.0000 [IU] | Freq: Every day | SUBCUTANEOUS | Status: DC
  Administered 2018-02-28 – 2018-03-03 (×4): 60 [IU] via SUBCUTANEOUS
  Filled 2018-02-28 (×5): qty 0.6

## 2018-02-28 NOTE — Progress Notes (Signed)
D-Dimer resulted at intermediate possibility of PE at 1.07. CT Angio of chest ordered per nurse care order by Selena BattenKim MD. Will continue to monitor pt. Closely.

## 2018-02-28 NOTE — Progress Notes (Signed)
  Echocardiogram 2D Echocardiogram has been performed.  Celene SkeenVijay  Scottlyn Mchaney 02/28/2018, 3:17 PM

## 2018-02-28 NOTE — Progress Notes (Addendum)
PROGRESS NOTE    [name]  WUJ:[id number] DOB: Jan 21, 1951 DOA: 02/27/2018 PCP: [name], Lafayette Lyle, MD    Brief Narrative:  [phone] who with hx of DM2, hld, hypothyroid, dementia, COPD presents with increased cough, sob, wheezing. Patient was admitted for further work up and treatment of COPD exacerbation  Assessment & Plan:   Principal Problem:   COPD exacerbation (HCC) Active Problems:   Diabetes (HCC)  Dyspnea, cough secondary to Copd exacerbation and RLL PNA -Started on Solumedrol 80mg  iv q8h. Given poorly controlled glucose, will decrease to 60mg  IV q8hrs -Currently on zithromax 500mg  iv qday  -Continue with spiriva 1puff qday -Continue on Albuterol neb 1 po q6h and q6h prn  -Continues to require 3LNC, new O2 requirement -CT chest is notable for large area of consolidation involving RLL -Will add rocephin to azithro to cover CAP  Afib with RVR -Troponin serially negative -CTA chest neg for PE -ordered 2d echo -TSH within normal limits -Improved with beta blocker -Continue scheduled beta blocker as tolerated -Cont coumadin pharmacy to dose -Repeat bmet in AM  Hypothyroidism -TSH within normal lmits -Cont levothyroxine as tolerated  Anxiety, Mood disorder -Continue with Zoloft 200mg  po qday -Will continue on Risperdal 2mg  po qhs -Continue with Navane -Will continue with Aricept -Appears sleepy but stable this AM  Bph -Cont Flomax as tolerated  Hyperlipidemia -Continue on statin as tolerated  Diabetes Mellitus 2 -Very poorly controlled in setting of steroid use -continued metformin and home lantus -Have started scheduled meal coverage -Increase SSI coverage to resistant scale -Repeat bmet in AM  DVT prophylaxis: coumadin, lovenox bridge Code Status: Full Family Communication: Pt in room, family not at bedside Disposition Plan: Uncertain at this time  Consultants:   Procedures:     Antimicrobials: Anti-infectives (From admission,  onward)   Start     Dose/Rate Route Frequency Ordered Stop   02/27/18 2330  azithromycin (ZITHROMAX) 500 mg in sodium chloride 0.9 % 250 mL IVPB     500 mg 250 mL/hr over 60 Minutes Intravenous Daily at bedtime 02/27/18 2322         Subjective: Without complaints. Reports feeling better today  Objective: Vitals:   02/28/18 0448 02/28/18 0500 02/28/18 0741 02/28/18 1249  BP: 117/72   (!) 103/55  Pulse: 68   62  Resp: 20     Temp: (!) 97.4 F (36.3 C)   98.4 F (36.9 C)  TempSrc:    Oral  SpO2: 94%  96% 93%  Weight:  97.6 kg    Height:        Intake/Output Summary (Last 24 hours) at 02/28/2018 1335 Last data filed at 02/28/2018 1300 Gross per 24 hour  Intake 964.36 ml  Output 1800 ml  Net -835.64 ml   Filed Weights   02/27/18 1856 02/27/18 2335 02/28/18 0500  Weight: 104.3 kg 98.3 kg 97.6 kg    Examination:  General exam: Appears calm and comfortable  Respiratory system: Clear to auscultation. Respiratory effort normal. Cardiovascular system: S1 & S2 heard, RRR Gastrointestinal system: Abdomen is nondistended, soft and nontender. No organomegaly or masses felt. Normal bowel sounds heard. Central nervous system: Alert and oriented. No focal neurological deficits. Extremities: Symmetric 5 x 5 power. Skin: No rashes, lesions or ulcers Psychiatry: arousable, appears somewhat confused.   Data Reviewed: I have personally reviewed following labs and imaging studies  CBC: Recent Labs  Lab 02/27/18 1923 02/28/18 0435  WBC 12.6* 12.4*  NEUTROABS 10.6*  --   HGB 12.2*  12.1*  HCT 35.8* 36.0*  MCV 89.7 90.9  PLT 165 153   Basic Metabolic Panel: Recent Labs  Lab 02/27/18 1923 02/28/18 0435  NA 134* 136  K 4.5 4.6  CL 103 107  CO2 19* 22  GLUCOSE 314* 468*  BUN 21 21  CREATININE 0.92 0.77  CALCIUM 8.8* 8.9   GFR: Estimated Creatinine Clearance: 106.7 mL/min (by C-G formula based on SCr of 0.77 mg/dL). Liver Function Tests: Recent Labs  Lab 02/28/18 0435   AST 16  ALT 18  ALKPHOS 70  BILITOT 1.0  PROT 6.3*  ALBUMIN 2.7*   No results for input(s): LIPASE, AMYLASE in the last 168 hours. No results for input(s): AMMONIA in the last 168 hours. Coagulation Profile: Recent Labs  Lab 02/27/18 1923 02/28/18 0435  INR 1.13 1.26   Cardiac Enzymes: Recent Labs  Lab 02/27/18 1923 02/28/18 0139 02/28/18 0731  TROPONINI <0.03 <0.03 <0.03   BNP (last 3 results) No results for input(s): PROBNP in the last 8760 hours. HbA1C: No results for input(s): HGBA1C in the last 72 hours. CBG: Recent Labs  Lab 02/27/18 1916 02/27/18 2341 02/28/18 0723 02/28/18 1200 02/28/18 1328  GLUCAP 300* 335* 431* 513* 537*   Lipid Profile: No results for input(s): CHOL, HDL, LDLCALC, TRIG, CHOLHDL, LDLDIRECT in the last 72 hours. Thyroid Function Tests: Recent Labs    02/28/18 0139  TSH 1.242   Anemia Panel: No results for input(s): VITAMINB12, FOLATE, FERRITIN, TIBC, IRON, RETICCTPCT in the last 72 hours. Sepsis Labs: No results for input(s): PROCALCITON, LATICACIDVEN in the last 168 hours.  No results found for this or any previous visit (from the past 240 hour(s)).   Radiology Studies: Dg Chest 2 View  Result Date: 02/27/2018 CLINICAL DATA:  New onset weakness and cough. EXAM: CHEST - 2 VIEW COMPARISON:  05/01/2011 FINDINGS: Stable cardiomegaly with mild aortic atherosclerosis. No acute pulmonary consolidation or CHF. Chronic mild bronchitic change of the lungs with increased interstitial lung markings and peribronchial thickening. No effusion or pneumothorax. Osteoarthritis of the [facility] and glenohumeral joints. Degenerative changes are present along the dorsal spine without significant change. IMPRESSION: Chronic bronchitic change of the lungs. Electronically Signed   By: Tollie Eth M.D.   On: 02/27/2018 20:02   Ct Angio Chest Pe W Or Wo Contrast  Result Date: 02/28/2018 CLINICAL DATA:  67 year old male with shortness of breath and chest pain.  Elevated D-dimer. EXAM: CT ANGIOGRAPHY CHEST WITH CONTRAST TECHNIQUE: Multidetector CT imaging of the chest was performed using the standard protocol during bolus administration of intravenous contrast. Multiplanar CT image reconstructions and MIPs were obtained to evaluate the vascular anatomy. CONTRAST:  ISOVUE-370 IOPAMIDOL (ISOVUE-370) INJECTION 76% COMPARISON:  Chest radiograph dated 02/27/2018 FINDINGS: Cardiovascular: Borderline cardiomegaly. There is a 3 vessel coronary vascular calcification. Mild atherosclerotic calcification of the thoracic aorta. No aneurysmal dilatation or dissection. Evaluation of the pulmonary arteries is somewhat limited due to respiratory motion artifact and suboptimal visualization of the distal and peripheral branches. No large or central pulmonary artery embolus identified. Mediastinum/Nodes: No hilar or mediastinal adenopathy. Esophagus is grossly unremarkable. No mediastinal fluid collection. Lungs/Pleura: There is a large area of consolidation in the right lower lobe most consistent with pneumonia. Clinical correlation and follow-up to resolution is recommended to exclude underlying mass. There is no pleural effusion or pneumothorax. There is occlusion of the right lower lobe bronchus. 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 Upper Abdomen: Fatty liver with morphologic changes of cirrhosis. There is a 4 cm rim calcified  stone in the gallbladder. There is splenomegaly. The spleen is only partially visualized. Musculoskeletal: No chest wall abnormality. No acute or significant osseous findings. Review of the MIP images confirms the above findings. IMPRESSION: 1. No CT evidence of central pulmonary artery embolus. 2. Large area of consolidation involving the right lower lobe most consistent with pneumonia. Clinical correlation and follow-up to resolution recommended. There is occlusion of the right lower lobe bronchus which may be related to mucous impaction although an endobronchial  lesion is not entirely excluded. 3. Mild cardiomegaly and coronary vascular calcification. 4. Cirrhosis, cholelithiasis, and splenomegaly. Electronically Signed   By: Elgie Collard M.D.   On: 02/28/2018 06:13    Scheduled Meds: . albuterol  2.5 mg Nebulization BID  . atorvastatin  40 mg Oral q1800  . donepezil  10 mg Oral Daily  . enoxaparin (LOVENOX) injection  100 mg Subcutaneous BID  . insulin aspart  0-20 Units Subcutaneous TID WC  . insulin aspart  0-5 Units Subcutaneous QHS  . insulin aspart  10 Units Subcutaneous TID WC  . insulin aspart  25 Units Subcutaneous Once  . insulin glargine  60 Units Subcutaneous QHS  . iopamidol      . levothyroxine  25 mcg Oral QAC breakfast  . metFORMIN  500 mg Oral BID WC  . methylPREDNISolone (SOLU-MEDROL) injection  80 mg Intravenous Q8H  . metoprolol tartrate  37.5 mg Oral BID  . risperidone  2 mg Oral QHS  . sertraline  200 mg Oral Daily  . tamsulosin  0.4 mg Oral QHS  . thiothixene  5 mg Oral QHS  . tiotropium  18 mcg Inhalation Daily  . warfarin  12.5 mg Oral ONCE-1800  . Warfarin - Pharmacist Dosing Inpatient   Does not apply q1800   Continuous Infusions: . sodium chloride 75 mL/hr at 02/28/18 0559  . azithromycin Stopped (02/28/18 0116)     LOS: 0 days   Rickey Barbara, MD [facility] Pager On Amion  If 7PM-7AM, please contact night-coverage 02/28/2018, 1:35 PM

## 2018-02-28 NOTE — Progress Notes (Signed)
PT Cancellation Note  Patient Details Name: [name] MRN: [id number] DOB: Sep 15, 1950   Cancelled Treatment:    Reason Eval/Treat Not Completed: Fatigue/lethargy limiting ability to participate Pt sleeping on arrival.  Pt awakened easily however declined to participate due to being tired and wishing to sleep.  Will check back as schedule permits.   [name],[name] 02/28/2018, 4:05 PM Zenovia JarredKati Tayvia Faughnan, PT, DPT 02/28/2018 Pager: [phone]

## 2018-02-28 NOTE — Progress Notes (Signed)
Pt. In Afib with RVR rhythm HR in 140-150's. Kim MD on the floor at the time. 5 mg IV and 37.5 mg PO metoprolol given. HR decreased to 115-120's with BP of 128/74 manually. Kim MD paged and made aware. Verbal order for one time dose of 5 mg IV metoprolol given. Will administer medication and will continue to monitor pt. Closely.

## 2018-02-28 NOTE — Progress Notes (Signed)
ANTICOAGULATION CONSULT NOTE - follow up  Pharmacy Consult for warfarin Indication: atrial fibrillation  No Known Allergies  Patient Measurements: Height: 5\' 11"  (180.3 cm) Weight: 215 lb 2.7 oz (97.6 kg) IBW/kg (Calculated) : 75.3 Heparin Dosing Weight:   Vital Signs: Temp: 97.4 F (36.3 C) (08/30 0448) BP: 117/72 (08/30 0448) Pulse Rate: 68 (08/30 0448)  Labs: Recent Labs    02/27/18 1923 02/28/18 0139 02/28/18 0435  HGB 12.2*  --  12.1*  HCT 35.8*  --  36.0*  PLT 165  --  153  LABPROT 14.4  --  15.6*  INR 1.13  --  1.26  CREATININE 0.92  --  0.77  TROPONINI <0.03 <0.03  --     Estimated Creatinine Clearance: 106.7 mL/min (by C-G formula based on SCr of 0.77 mg/dL).   Medical History: Past Medical History:  Diagnosis Date  . Anxiety   . Atrial fibrillation (HCC)   . Bipolar disorder (HCC)   . COPD (chronic obstructive pulmonary disease) (HCC)   . Dementia   . Depression   . Diabetes mellitus   . Diabetes mellitus without complication (HCC)   . High cholesterol   . Hyperlipidemia   . Hypothyroidism     Assessment: 67 yo male admitted with weakness, AECOPD on warfarin prior to admission for afib at a dose of 10mg  daily.  INR on admit 1.13.  Last dose of warfarin noted per med rec on 8/28 at 1800.  Today, 02/28/18  INR subtherapeutic - 10mg  warfarin given around midnight last night  CBC stable  No reported bleeding  Lovenox 1mg /kg SQ q12 until INR therapeutic   Goal of Therapy:  INR 2-3 Monitor platelets by anticoagulation protocol: Yes   Plan:  1) Warfarin 12.5mg  tonight at 1800 2) Daily INR 3) Continue Lovenox 100mg  SQ q12 until INR therapeutic   Hessie KnowsJustin M Kaedance Magos, PharmD, BCPS Pager [phone] 02/28/2018 7:26 AM

## 2018-03-01 DIAGNOSIS — J189 Pneumonia, unspecified organism: Secondary | ICD-10-CM

## 2018-03-01 DIAGNOSIS — R0902 Hypoxemia: Secondary | ICD-10-CM

## 2018-03-01 LAB — COMPREHENSIVE METABOLIC PANEL
ALK PHOS: 58 U/L (ref 38–126)
ALT: 16 U/L (ref 0–44)
ANION GAP: 10 (ref 5–15)
AST: 13 U/L — ABNORMAL LOW (ref 15–41)
Albumin: 2.5 g/dL — ABNORMAL LOW (ref 3.5–5.0)
BILIRUBIN TOTAL: 0.6 mg/dL (ref 0.3–1.2)
BUN: 28 mg/dL — ABNORMAL HIGH (ref 8–23)
CO2: 20 mmol/L — ABNORMAL LOW (ref 22–32)
Calcium: 8.4 mg/dL — ABNORMAL LOW (ref 8.9–10.3)
Chloride: 106 mmol/L (ref 98–111)
Creatinine, Ser: 0.7 mg/dL (ref 0.61–1.24)
GFR calc non Af Amer: >60 mL/min (ref >60)
GLUCOSE: 410 mg/dL — AB (ref 70–99)
POTASSIUM: 4.5 mmol/L (ref 3.5–5.1)
SODIUM: 136 mmol/L (ref 135–145)
TOTAL PROTEIN: 5.6 g/dL — AB (ref 6.5–8.1)

## 2018-03-01 LAB — CBC
HEMATOCRIT: 32.4 % — AB (ref 39.0–52.0)
HEMOGLOBIN: 10.8 g/dL — AB (ref 13.0–17.0)
MCH: 30.9 pg (ref 26.0–34.0)
MCHC: 33.3 g/dL (ref 30.0–36.0)
MCV: 92.6 fL (ref 78.0–100.0)
Platelets: 165 10*3/uL (ref 150–400)
RBC: 3.5 MIL/uL — AB (ref 4.22–5.81)
RDW: 14.8 % (ref 11.5–15.5)
WBC: 10.4 10*3/uL (ref 4.0–10.5)

## 2018-03-01 LAB — TROPONIN I: Troponin I: <0.03 ng/mL (ref <0.03)

## 2018-03-01 LAB — PROTIME-INR
INR: 1.45
Prothrombin Time: 17.5 seconds — ABNORMAL HIGH (ref 11.4–15.2)

## 2018-03-01 LAB — URINE CULTURE: Culture: <10000 — AB

## 2018-03-01 LAB — GLUCOSE, CAPILLARY
GLUCOSE-CAPILLARY: 276 mg/dL — AB (ref 70–99)
GLUCOSE-CAPILLARY: 282 mg/dL — AB (ref 70–99)
GLUCOSE-CAPILLARY: 143 mg/dL — AB (ref 70–99)
Glucose-Capillary: 325 mg/dL — ABNORMAL HIGH (ref 70–99)
Glucose-Capillary: 165 mg/dL — ABNORMAL HIGH (ref 70–99)

## 2018-03-01 LAB — D-DIMER, QUANTITATIVE (NOT AT ARMC): D DIMER QUANT: 0.5 ug{FEU}/mL (ref 0.00–0.50)

## 2018-03-01 MED ORDER — METHYLPREDNISOLONE SODIUM SUCC 40 MG IJ SOLR
40.0000 mg | Freq: Two times a day (BID) | INTRAMUSCULAR | Status: DC
  Administered 2018-03-01 – 2018-03-02 (×2): 40 mg via INTRAVENOUS
  Filled 2018-03-01 (×2): qty 1

## 2018-03-01 MED ORDER — BENZONATATE 100 MG PO CAPS
100.0000 mg | ORAL_CAPSULE | Freq: Three times a day (TID) | ORAL | Status: DC | PRN
  Administered 2018-03-01 – 2018-03-02 (×3): 100 mg via ORAL
  Filled 2018-03-01 (×3): qty 1

## 2018-03-01 MED ORDER — WARFARIN SODIUM 5 MG PO TABS
12.5000 mg | ORAL_TABLET | Freq: Once | ORAL | Status: AC
  Administered 2018-03-01: 12.5 mg via ORAL
  Filled 2018-03-01: qty 1

## 2018-03-01 NOTE — Evaluation (Signed)
Clinical/Bedside Swallow Evaluation Patient Details  Name: [name] MRN: [id number] Date of Birth: August 03, 1950  Today's Date: 03/01/2018 Time: SLP Start Time (ACUTE ONLY): 1214 SLP Stop Time (ACUTE ONLY): 1235 SLP Time Calculation (min) (ACUTE ONLY): 21 min  Past Medical History:  Past Medical History:  Diagnosis Date  . Anxiety   . Atrial fibrillation (HCC)   . Bipolar disorder (HCC)   . COPD (chronic obstructive pulmonary disease) (HCC)   . Dementia   . Depression   . Diabetes mellitus   . Diabetes mellitus without complication (HCC)   . High cholesterol   . Hyperlipidemia   . Hypothyroidism    Past Surgical History:  Past Surgical History:  Procedure Laterality Date  . APPENDECTOMY     HPI:   67 y.o. male, w hypertension, hyperlipidemia, dm2, hypothyroidism, dementia, Pafib, Copd presents with cough, dry.  + dyspnea and wheezing   Nursing reported coughing after a meal, but [facility] CT chest indicated Large area of consolidation involving the right lower lobe most consistent with pneumonia. Clinical correlation and follow-up to resolution recommended. There is occlusion of the right lower lobe bronchus which may be related to mucous impaction although an endobronchial lesion is not entirely excluded.  Assessment / Plan / Recommendation Clinical Impression   Pt without overt s/s of aspiration noted during intake of varying volumes of thin liquids-->soft solids/solids; pt exhibited one incidence of coughing during meal (d/t decreased sustained attention/talking/impulsivity), but otherwise consumed all consistencies without dysphagia noted; nursing stated he consumed medications without dysphagia as well; nursing reported coughing after intake of meal, but this was not noted during BSE; ST will f/u x1 for meal tolerance and need for possible objective assessment prn while in acute setting. SLP Visit Diagnosis: Dysphagia, unspecified  (R13.10)    Aspiration Risk  Mild aspiration risk    Diet Recommendation   Regular/thin liquid consistency  Medication Administration: Whole meds with liquid    Other  Recommendations Oral Care Recommendations: Oral care BID   Follow up Recommendations None      Frequency and Duration     f/u x1 for meal tolerance       Prognosis Prognosis for Safe Diet Advancement: Good      Swallow Study   General Date of Onset: 02/27/18 HPI:  [age] y.o. male, w hypertension, hyperlipidemia, dm2, hypothyroidism, dementia, Pafib, Copd presents with cough, dry.  + dyspnea and wheezing   Type of Study: Bedside Swallow Evaluation Previous Swallow Assessment: (n/a) Diet Prior to this Study: Regular;Thin liquids Temperature Spikes Noted: No Respiratory Status: Room air History of Recent Intubation: No Behavior/Cognition: Alert;Cooperative;Impulsive Oral Cavity Assessment: Within Functional Limits Oral Care Completed by SLP: No Oral Cavity - Dentition: Adequate natural dentition;Missing dentition Vision: Functional for self-feeding Self-Feeding Abilities: Able to feed self Patient Positioning: Upright in bed Baseline Vocal Quality: Normal Volitional Cough: Strong Volitional Swallow: Able to elicit    Oral/Motor/Sensory Function Overall Oral Motor/Sensory Function: Within functional limits   Ice Chips Ice chips: Not tested   Thin Liquid Thin Liquid: Within functional limits Presentation: Cup;Straw    Nectar Thick Nectar Thick Liquid: Not tested   Honey Thick Honey Thick Liquid: Not tested   Puree Puree: Within functional limits Presentation: Self Fed   Solid     Solid: Within functional limits Presentation: Self Fed      Tressie StalkerPat Keyaira Clapham, M.S., CCC-SLP 03/01/2018,12:49 PM

## 2018-03-01 NOTE — Progress Notes (Signed)
PT Cancellation Note  Patient Details Name: [name] MRN: [id number] DOB: 10/09/1950   Cancelled Treatment:    Reason Eval/Treat Not Completed: Fatigue/lethargy limiting ability to participate Patient asleep in bed, easily woken but politely declining PT this afternoon due to being very tired/wanting to sleep some more. Will try to attempt to return if time and and schedule allow, otherwise will attempt on next day of service.    Nedra HaiKristen Willetta York PT, DPT, CBIS  Supplemental Physical Therapist [facility]   Pager [phone]

## 2018-03-01 NOTE — Progress Notes (Signed)
PROGRESS NOTE    [name]  FAO:[id number] DOB: Nov 21, 1950 DOA: 02/27/2018 PCP: [name], Lafayette Lyle, MD    Brief Narrative:  [phone] who with hx of DM2, hld, hypothyroid, dementia, COPD presents with increased cough, sob, wheezing. Patient was admitted for further work up and treatment of COPD exacerbation  Assessment & Plan:   Principal Problem:   COPD exacerbation (HCC) Active Problems:   Diabetes (HCC)  Dyspnea, cough secondary to Copd exacerbation and RLL PNA -Started on Solumedrol 80mg  iv q8h with azithromycin and rocephin -Pt had also been continued on scheduled bronchodilators -O2 successfully weaned to room air today -Coughing noted while eating, question aspiration -Have consulted SLP -Will wean solumedrol to 40mg  IV q12hrs  Afib with RVR -Troponin serially negative -CTA chest neg for PE -ordered 2d echo -TSH within normal limits -Improved with beta blocker -Continue scheduled beta blocker as patient tolerates -Cont coumadin pharmacy to dose -Recheck bmet in AM -Currently rate controlled  Hypothyroidism -TSH within normal lmits -will continue with thyroid replacement as tolerated  Anxiety, Mood disorder -Continue with Zoloft 200mg  po qday -Will continue on Risperdal 2mg  po qhs -Continue with Navane -Will continue with Aricept -Appears more alert this AM. Stable  Bph -Cont Flomax as tolerated -Presently stable  Hyperlipidemia -Continue on statin as tolerated -Presently stable  Diabetes Mellitus 2 -Very poorly controlled in setting of steroid use -continued metformin and home lantus -Have started scheduled meal coverage -Increase SSI coverage to resistant scale -Glucose improved. -Continue steroid wean. Titrate insulin to achieve euglycemia  DVT prophylaxis: coumadin, lovenox bridge Code Status: Full Family Communication: Pt in room, family not at bedside Disposition Plan: Uncertain at this time  Consultants:   Procedures:      Antimicrobials: Anti-infectives (From admission, onward)   Start     Dose/Rate Route Frequency Ordered Stop   02/28/18 1500  cefTRIAXone (ROCEPHIN) 1 g in sodium chloride 0.9 % 100 mL IVPB     1 g 200 mL/hr over 30 Minutes Intravenous Every 24 hours 02/28/18 1352     02/27/18 2330  azithromycin (ZITHROMAX) 500 mg in sodium chloride 0.9 % 250 mL IVPB     500 mg 250 mL/hr over 60 Minutes Intravenous Daily at bedtime 02/27/18 2322        Subjective: No complaints this AM  Objective: Vitals:   03/01/18 0527 03/01/18 0813 03/01/18 0815 03/01/18 0958  BP: 96/62   (!) 115/57  Pulse: (!) 55   68  Resp: 18   18  Temp: (!) 97.4 F (36.3 C)     TempSrc: Axillary     SpO2: 96% 94% 94% 96%  Weight: 97.3 kg     Height:        Intake/Output Summary (Last 24 hours) at 03/01/2018 1155 Last data filed at 03/01/2018 0600 Gross per 24 hour  Intake 2195 ml  Output 1400 ml  Net 795 ml   Filed Weights   02/27/18 2335 02/28/18 0500 03/01/18 0527  Weight: 98.3 kg 97.6 kg 97.3 kg    Examination: General exam: Awake, laying in bed, in nad Respiratory system: Normal respiratory effort, no wheezing Cardiovascular system: regular rate, s1, s2 Gastrointestinal system: Soft, nondistended, positive BS Central nervous system: CN2-12 grossly intact, strength intact Extremities: Perfused, no clubbing Skin: Normal skin turgor, no notable skin lesions seen Psychiatry: Mood normal // no visual hallucinations   Data Reviewed: I have personally reviewed following labs and imaging studies  CBC: Recent Labs  Lab 02/27/18 1923 02/28/18 0435 03/01/18  0150  WBC 12.6* 12.4* 10.4  NEUTROABS 10.6*  --   --   HGB 12.2* 12.1* 10.8*  HCT 35.8* 36.0* 32.4*  MCV 89.7 90.9 92.6  PLT 165 153 165   Basic Metabolic Panel: Recent Labs  Lab 02/27/18 1923 02/28/18 0435 02/28/18 1331 03/01/18 0150  NA 134* 136 137 136  K 4.5 4.6 4.5 4.5  CL 103 107 108 106  CO2 19* 22 20* 20*  GLUCOSE 314* 468*  541* 410*  BUN 21 21 24* 28*  CREATININE 0.92 0.77 0.85 0.70  CALCIUM 8.8* 8.9 8.9 8.4*   GFR: Estimated Creatinine Clearance: 106.6 mL/min (by C-G formula based on SCr of 0.7 mg/dL). Liver Function Tests: Recent Labs  Lab 02/28/18 0435 03/01/18 0150  AST 16 13*  ALT 18 16  ALKPHOS 70 58  BILITOT 1.0 0.6  PROT 6.3* 5.6*  ALBUMIN 2.7* 2.5*   No results for input(s): LIPASE, AMYLASE in the last 168 hours. No results for input(s): AMMONIA in the last 168 hours. Coagulation Profile: Recent Labs  Lab 02/27/18 1923 02/28/18 0435 03/01/18 0150  INR 1.13 1.26 1.45   Cardiac Enzymes: Recent Labs  Lab 02/27/18 1923 02/28/18 0139 02/28/18 0731 02/28/18 1331 03/01/18 0150  TROPONINI <0.03 <0.03 <0.03 <0.03 <0.03   BNP (last 3 results) No results for input(s): PROBNP in the last 8760 hours. HbA1C: Recent Labs    02/28/18 0435  HGBA1C 10.8*   CBG: Recent Labs  Lab 02/28/18 1650 02/28/18 1844 02/28/18 2055 03/01/18 0522 03/01/18 0754  GLUCAP 304* 212* 131* 325* 276*   Lipid Profile: No results for input(s): CHOL, HDL, LDLCALC, TRIG, CHOLHDL, LDLDIRECT in the last 72 hours. Thyroid Function Tests: Recent Labs    02/28/18 0139  TSH 1.242   Anemia Panel: No results for input(s): VITAMINB12, FOLATE, FERRITIN, TIBC, IRON, RETICCTPCT in the last 72 hours. Sepsis Labs: No results for input(s): PROCALCITON, LATICACIDVEN in the last 168 hours.  Recent Results (from the past 240 hour(s))  Urine culture     Status: Abnormal   Collection Time: 02/27/18  7:23 PM  Result Value Ref Range Status   Specimen Description   Final    URINE, CLEAN CATCH Performed at [facility], [address], [place], [place] [address]    Special Requests   Final    NONE Performed at [facility], [address], [place], [place] [address]    Culture (A)  Final    <10,000 COLONIES/mL INSIGNIFICANT GROWTH Performed at [facility], [address], [place], [place] [address]    Report Status 03/01/2018 FINAL  Final     Radiology Studies: Dg Chest 2 View  Result Date: 02/27/2018 CLINICAL DATA:  New onset weakness and cough. EXAM: CHEST - 2 VIEW COMPARISON:  05/01/2011 FINDINGS: Stable cardiomegaly with mild aortic atherosclerosis. No acute pulmonary consolidation or CHF. Chronic mild bronchitic change of the lungs with increased interstitial lung markings and peribronchial thickening. No effusion or pneumothorax. Osteoarthritis of the [facility] and glenohumeral joints. Degenerative changes are present along the dorsal spine without significant change. IMPRESSION: Chronic bronchitic change of the lungs. Electronically Signed   By: Tollie Eth M.D.   On: 02/27/2018 20:02   Ct Angio Chest Pe W Or Wo Contrast  Result Date: 02/28/2018 CLINICAL DATA:  67 year old male with shortness of breath and chest pain. Elevated D-dimer. EXAM: CT ANGIOGRAPHY CHEST WITH CONTRAST TECHNIQUE: Multidetector CT imaging of the chest was performed using the standard protocol  during bolus administration of intravenous contrast. Multiplanar CT image reconstructions and MIPs were obtained to evaluate the vascular anatomy. CONTRAST:  ISOVUE-370 IOPAMIDOL (ISOVUE-370) INJECTION 76% COMPARISON:  Chest radiograph dated 02/27/2018 FINDINGS: Cardiovascular: Borderline cardiomegaly. There is a 3 vessel coronary vascular calcification. Mild atherosclerotic calcification of the thoracic aorta. No aneurysmal dilatation or dissection. Evaluation of the pulmonary arteries is somewhat limited due to respiratory motion artifact and suboptimal visualization of the distal and peripheral branches. No large or central pulmonary artery embolus identified. Mediastinum/Nodes: No hilar or mediastinal adenopathy. Esophagus is grossly unremarkable. No mediastinal fluid collection. Lungs/Pleura: There is a large area of consolidation in the right lower lobe most consistent with pneumonia. Clinical  correlation and follow-up to resolution is recommended to exclude underlying mass. There is no pleural effusion or pneumothorax. There is occlusion of the right lower lobe bronchus. 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 Upper Abdomen: Fatty liver with morphologic changes of cirrhosis. There is a 4 cm rim calcified stone in the gallbladder. There is splenomegaly. The spleen is only partially visualized. Musculoskeletal: No chest wall abnormality. No acute or significant osseous findings. Review of the MIP images confirms the above findings. IMPRESSION: 1. No CT evidence of central pulmonary artery embolus. 2. Large area of consolidation involving the right lower lobe most consistent with pneumonia. Clinical correlation and follow-up to resolution recommended. There is occlusion of the right lower lobe bronchus which may be related to mucous impaction although an endobronchial lesion is not entirely excluded. 3. Mild cardiomegaly and coronary vascular calcification. 4. Cirrhosis, cholelithiasis, and splenomegaly. Electronically Signed   By: Elgie Collard M.D.   On: 02/28/2018 06:13    Scheduled Meds: . albuterol  2.5 mg Nebulization BID  . atorvastatin  40 mg Oral q1800  . donepezil  10 mg Oral Daily  . enoxaparin (LOVENOX) injection  100 mg Subcutaneous BID  . insulin aspart  0-20 Units Subcutaneous TID WC  . insulin aspart  0-5 Units Subcutaneous QHS  . insulin aspart  10 Units Subcutaneous TID WC  . insulin glargine  60 Units Subcutaneous QHS  . levothyroxine  25 mcg Oral QAC breakfast  . metFORMIN  500 mg Oral BID WC  . methylPREDNISolone (SOLU-MEDROL) injection  40 mg Intravenous Q12H  . metoprolol tartrate  37.5 mg Oral BID  . risperidone  2 mg Oral QHS  . sertraline  200 mg Oral Daily  . tamsulosin  0.4 mg Oral QHS  . thiothixene  5 mg Oral QHS  . tiotropium  18 mcg Inhalation Daily  . warfarin  12.5 mg Oral ONCE-1800  . Warfarin - Pharmacist Dosing Inpatient   Does not apply q1800   Continuous  Infusions: . sodium chloride 75 mL/hr at 02/28/18 2300  . azithromycin 500 mg (02/28/18 2200)  . cefTRIAXone (ROCEPHIN)  IV 1 g (02/28/18 1529)     LOS: 1 day   Rickey Barbara, MD [facility] Pager On Amion  If 7PM-7AM, please contact night-coverage 03/01/2018, 11:55 AM

## 2018-03-01 NOTE — Progress Notes (Signed)
ANTICOAGULATION CONSULT NOTE - follow up  Pharmacy Consult for warfarin, Lovenox Indication: atrial fibrillation  No Known Allergies  Patient Measurements: Height: 5\' 11"  (180.3 cm) Weight: 214 lb 8.1 oz (97.3 kg) IBW/kg (Calculated) : 75.3 Heparin Dosing Weight:   Vital Signs: Temp: 97.4 F (36.3 C) (08/31 0527) Temp Source: Axillary (08/31 0527) BP: 115/57 (08/31 0958) Pulse Rate: 68 (08/31 0958)  Labs: Recent Labs    02/27/18 1923  02/28/18 0435 02/28/18 0731 02/28/18 1331 03/01/18 0150  HGB 12.2*  --  12.1*  --   --  10.8*  HCT 35.8*  --  36.0*  --   --  32.4*  PLT 165  --  153  --   --  165  LABPROT 14.4  --  15.6*  --   --  17.5*  INR 1.13  --  1.26  --   --  1.45  CREATININE 0.92  --  0.77  --  0.85 0.70  TROPONINI <0.03   < >  --  <0.03 <0.03 <0.03   < > = values in this interval not displayed.    Estimated Creatinine Clearance: 106.6 mL/min (by C-G formula based on SCr of 0.7 mg/dL).   Medical History: Past Medical History:  Diagnosis Date  . Anxiety   . Atrial fibrillation (HCC)   . Bipolar disorder (HCC)   . COPD (chronic obstructive pulmonary disease) (HCC)   . Dementia   . Depression   . Diabetes mellitus   . Diabetes mellitus without complication (HCC)   . High cholesterol   . Hyperlipidemia   . Hypothyroidism     Assessment: 67 yo male admitted with weakness, AECOPD on warfarin prior to admission for afib at a dose of 10mg  daily.  INR on admit 1.13.  Last dose of warfarin noted per med rec on 8/28 at 1800.  Today, 03/01/18  INR subtherapeutic but starting to respond - inpatient doses of warfarin so far are 10mg  and 12.5mg    Hgb 10.8 down some, monitor. Plts stable  No reported bleeding  Lovenox 1mg /kg SQ q12 until INR therapeutic   Goal of Therapy:  INR 2-3 Monitor platelets by anticoagulation protocol: Yes   Plan:  1) Repeat warfarin 12.5mg  tonight at 1800 2) Daily INR and CBC 3) Continue Lovenox 100mg  SQ q12 until INR  therapeutic   Hessie KnowsJustin M Skylar Priest, PharmD, BCPS Pager [phone] 03/01/2018 10:07 AM

## 2018-03-02 LAB — GLUCOSE, CAPILLARY
GLUCOSE-CAPILLARY: 238 mg/dL — AB (ref 70–99)
GLUCOSE-CAPILLARY: 307 mg/dL — AB (ref 70–99)
Glucose-Capillary: 232 mg/dL — ABNORMAL HIGH (ref 70–99)
Glucose-Capillary: 190 mg/dL — ABNORMAL HIGH (ref 70–99)

## 2018-03-02 LAB — BASIC METABOLIC PANEL
Anion gap: 10 (ref 5–15)
BUN: 22 mg/dL (ref 8–23)
CALCIUM: 8.5 mg/dL — AB (ref 8.9–10.3)
CO2: 20 mmol/L — ABNORMAL LOW (ref 22–32)
Chloride: 111 mmol/L (ref 98–111)
Creatinine, Ser: 0.59 mg/dL — ABNORMAL LOW (ref 0.61–1.24)
GFR calc Af Amer: >60 mL/min (ref >60)
GLUCOSE: 271 mg/dL — AB (ref 70–99)
POTASSIUM: 4.4 mmol/L (ref 3.5–5.1)
Sodium: 141 mmol/L (ref 135–145)

## 2018-03-02 LAB — PROTIME-INR
INR: 2.37
PROTHROMBIN TIME: 25.7 s — AB (ref 11.4–15.2)

## 2018-03-02 MED ORDER — METHYLPREDNISOLONE SODIUM SUCC 40 MG IJ SOLR
40.0000 mg | Freq: Every day | INTRAMUSCULAR | Status: DC
  Administered 2018-03-03: 40 mg via INTRAVENOUS
  Filled 2018-03-02: qty 1

## 2018-03-02 MED ORDER — WARFARIN SODIUM 5 MG PO TABS
7.5000 mg | ORAL_TABLET | Freq: Once | ORAL | Status: AC
  Administered 2018-03-02: 7.5 mg via ORAL
  Filled 2018-03-02: qty 1

## 2018-03-02 MED ORDER — GUAIFENESIN-DM 100-10 MG/5ML PO SYRP: 15.0000 mL | ORAL_SOLUTION | ORAL | Status: DC | PRN

## 2018-03-02 NOTE — Progress Notes (Signed)
PROGRESS NOTE    HICKMAN EMSWILER  PQZ:[id number] DOB: [birth year] DOA: 02/27/2018 PCP: Grace Isaac, MD    Brief Narrative:  [phone] who with hx of DM2, hld, hypothyroid, dementia, COPD presents with increased cough, sob, wheezing. Patient was admitted for further work up and treatment of COPD exacerbation  Assessment & Plan:   Principal Problem:   COPD exacerbation (HCC) Active Problems:   Diabetes (HCC)  Dyspnea, cough secondary to Copd exacerbation and RLL PNA -Started on Solumedrol 80mg  iv q8h with azithromycin and rocephin -Pt had also been continued on scheduled bronchodilators -O2 successfully weaned to room air  -Tolerating abx. Will wean solumedrol to 40mg  daily  Afib with RVR -Troponin serially negative -CTA chest neg for PE -ordered 2d echo -TSH within normal limits -Improved with beta blocker -Continue scheduled beta blocker as patient tolerates -Cont coumadin pharmacy to dose -Remains rate controlled  Hypothyroidism -TSH within normal lmits -continue thyroid replacement  Anxiety, Mood disorder -Continue with Zoloft 200mg  po qday -Will continue on Risperdal 2mg  po qhs -Continuing with Navane -Will continue with Aricept as tolerated -More alert since admit  Bph -Cont Flomax as tolerated -Stable at this time  Hyperlipidemia -Continue on statin as tolerated -Currently stable  Diabetes Mellitus 2 -Very poorly controlled in setting of steroid use -continued metformin and home lantus -Continue on scheduled meal coverage -Increased SSI coverage to resistant scale -Weaning steroids per above  DVT prophylaxis: coumadin, lovenox bridge Code Status: Full Family Communication: Pt in room, family not at bedside Disposition Plan: Uncertain at this time  Consultants:   Procedures:     Antimicrobials: Anti-infectives (From admission, onward)   Start     Dose/Rate Route Frequency Ordered Stop   02/28/18 1500  cefTRIAXone (ROCEPHIN) 1 g  in sodium chloride 0.9 % 100 mL IVPB     1 g 200 mL/hr over 30 Minutes Intravenous Every 24 hours 02/28/18 1352     02/27/18 2330  azithromycin (ZITHROMAX) 500 mg in sodium chloride 0.9 % 250 mL IVPB     500 mg 250 mL/hr over 60 Minutes Intravenous Daily at bedtime 02/27/18 2322        Subjective: Without complaints at this time  Objective: Vitals:   03/02/18 0751 03/02/18 0754 03/02/18 0855 03/02/18 1306  BP:   (!) 107/56 113/66  Pulse:   70 62  Resp:      Temp:    98.2 F (36.8 C)  TempSrc:    Oral  SpO2: 95% 95%  93%  Weight:      Height:        Intake/Output Summary (Last 24 hours) at 03/02/2018 1621 Last data filed at 03/02/2018 0551 Gross per 24 hour  Intake 1927.44 ml  Output 1100 ml  Net 827.44 ml   Filed Weights   02/28/18 0500 03/01/18 0527 03/02/18 0440  Weight: 97.6 kg 97.3 kg 99.6 kg    Examination: General exam: Conversant, in no acute distress Respiratory system: normal chest rise, clear, no audible wheezing Cardiovascular system: regular rhythm, s1-s2 Gastrointestinal system: Nondistended, nontender, pos BS Central nervous system: No seizures, no tremors Extremities: No cyanosis, no joint deformities Skin: No rashes, no pallor Psychiatry: Affect normal // no auditory hallucinations   Data Reviewed: I have personally reviewed following labs and imaging studies  CBC: Recent Labs  Lab 02/27/18 1923 02/28/18 0435 03/01/18 0150  WBC 12.6* 12.4* 10.4  NEUTROABS 10.6*  --   --   HGB 12.2* 12.1* 10.8*  HCT 35.8* 36.0* 32.4*  MCV 89.7 90.9 92.6  PLT 165 153 165   Basic Metabolic Panel: Recent Labs  Lab 02/27/18 1923 02/28/18 0435 02/28/18 1331 03/01/18 0150 03/02/18 0516  NA 134* 136 137 136 141  K 4.5 4.6 4.5 4.5 4.4  CL 103 107 108 106 111  CO2 19* 22 20* 20* 20*  GLUCOSE 314* 468* 541* 410* 271*  BUN 21 21 24* 28* 22  CREATININE 0.92 0.77 0.85 0.70 0.59*  CALCIUM 8.8* 8.9 8.9 8.4* 8.5*   GFR: Estimated Creatinine Clearance: 107.7  mL/min (A) (by C-G formula based on SCr of 0.59 mg/dL (L)). Liver Function Tests: Recent Labs  Lab 02/28/18 0435 03/01/18 0150  AST 16 13*  ALT 18 16  ALKPHOS 70 58  BILITOT 1.0 0.6  PROT 6.3* 5.6*  ALBUMIN 2.7* 2.5*   No results for input(s): LIPASE, AMYLASE in the last 168 hours. No results for input(s): AMMONIA in the last 168 hours. Coagulation Profile: Recent Labs  Lab 02/27/18 1923 02/28/18 0435 03/01/18 0150 03/02/18 0516  INR 1.13 1.26 1.45 2.37   Cardiac Enzymes: Recent Labs  Lab 02/27/18 1923 02/28/18 0139 02/28/18 0731 02/28/18 1331 03/01/18 0150  TROPONINI <0.03 <0.03 <0.03 <0.03 <0.03   BNP (last 3 results) No results for input(s): PROBNP in the last 8760 hours. HbA1C: Recent Labs    02/28/18 0435  HGBA1C 10.8*   CBG: Recent Labs  Lab 03/01/18 1614 03/01/18 2023 03/02/18 0728 03/02/18 1201 03/02/18 1617  GLUCAP 143* 165* 238* 307* 232*   Lipid Profile: No results for input(s): CHOL, HDL, LDLCALC, TRIG, CHOLHDL, LDLDIRECT in the last 72 hours. Thyroid Function Tests: Recent Labs    02/28/18 0139  TSH 1.242   Anemia Panel: No results for input(s): VITAMINB12, FOLATE, FERRITIN, TIBC, IRON, RETICCTPCT in the last 72 hours. Sepsis Labs: No results for input(s): PROCALCITON, LATICACIDVEN in the last 168 hours.  Recent Results (from the past 240 hour(s))  Urine culture     Status: Abnormal   Collection Time: 02/27/18  7:23 PM  Result Value Ref Range Status   Specimen Description   Final    URINE, CLEAN CATCH Performed at [facility], [address], [place], [place] [address]    Special Requests   Final    NONE Performed at [facility], [address], [place], [place] [address]    Culture (A)  Final    <10,000 COLONIES/mL INSIGNIFICANT GROWTH Performed at [facility], [address], [place], [place] [address]    Report Status 03/01/2018 FINAL  Final     Radiology Studies: No  results found.  Scheduled Meds: . albuterol  2.5 mg Nebulization BID  . atorvastatin  40 mg Oral q1800  . donepezil  10 mg Oral Daily  . insulin aspart  0-20 Units Subcutaneous TID WC  . insulin aspart  0-5 Units Subcutaneous QHS  . insulin aspart  10 Units Subcutaneous TID WC  . insulin glargine  60 Units Subcutaneous QHS  . levothyroxine  25 mcg Oral QAC breakfast  . metFORMIN  500 mg Oral BID WC  . methylPREDNISolone (SOLU-MEDROL) injection  40 mg Intravenous Q12H  . metoprolol tartrate  37.5 mg Oral BID  . risperidone  2 mg Oral QHS  . sertraline  200 mg Oral Daily  . tamsulosin  0.4 mg Oral QHS  . thiothixene  5 mg Oral QHS  . tiotropium  18 mcg Inhalation Daily  . warfarin  7.5 mg Oral ONCE-1800  .  Warfarin - Pharmacist Dosing Inpatient   Does not apply q1800   Continuous Infusions: . sodium chloride 75 mL/hr at 03/02/18 0551  . azithromycin Stopped (03/01/18 2206)  . cefTRIAXone (ROCEPHIN)  IV 1 g (03/02/18 1610)     LOS: 2 days   Rickey Barbara, MD [facility] Pager On Amion  If 7PM-7AM, please contact night-coverage 03/02/2018, 4:21 PM

## 2018-03-02 NOTE — Plan of Care (Signed)

## 2018-03-02 NOTE — Progress Notes (Signed)
PT Cancellation Note  Patient Details Name: [name] MRN: [id number] DOB: 07-Aug-1950   Cancelled Treatment:    Reason Eval/Treat Not Completed: Fatigue/lethargy limiting ability to participate.  Pt will be rescreened tomorrow and may dc if he refuses again.   Ivar Drape 03/02/2018, 11:58 AM   Samul Dada, PT MS Acute Rehab Dept. Number: [facility] [id number] and [facility] [phone]

## 2018-03-02 NOTE — Progress Notes (Signed)
ANTICOAGULATION CONSULT NOTE - follow up  Pharmacy Consult for warfarin, Lovenox Indication: atrial fibrillation  No Known Allergies  Patient Measurements: Height: 5\' 11"  (180.3 cm) Weight: 219 lb 9.3 oz (99.6 kg) IBW/kg (Calculated) : 75.3 Heparin Dosing Weight:   Vital Signs: Temp: 98.2 F (36.8 C) (09/01 0438) Temp Source: Oral (09/01 0437) BP: 107/56 (09/01 0855) Pulse Rate: 70 (09/01 0855)  Labs: Recent Labs    02/27/18 1923  02/28/18 0435 02/28/18 0731 02/28/18 1331 03/01/18 0150 03/02/18 0516  HGB 12.2*  --  12.1*  --   --  10.8*  --   HCT 35.8*  --  36.0*  --   --  32.4*  --   PLT 165  --  153  --   --  165  --   LABPROT 14.4  --  15.6*  --   --  17.5* 25.7*  INR 1.13  --  1.26  --   --  1.45 2.37  CREATININE 0.92  --  0.77  --  0.85 0.70 0.59*  TROPONINI <0.03   < >  --  <0.03 <0.03 <0.03  --    < > = values in this interval not displayed.    Estimated Creatinine Clearance: 107.7 mL/min (A) (by C-G formula based on SCr of 0.59 mg/dL (L)).   Medical History: Past Medical History:  Diagnosis Date  . Anxiety   . Atrial fibrillation (HCC)   . Bipolar disorder (HCC)   . COPD (chronic obstructive pulmonary disease) (HCC)   . Dementia   . Depression   . Diabetes mellitus   . Diabetes mellitus without complication (HCC)   . High cholesterol   . Hyperlipidemia   . Hypothyroidism     Assessment: 67 yo male admitted with weakness, AECOPD on warfarin prior to admission for afib at a dose of 10mg  daily.  INR on admit 1.13.  Last dose of warfarin noted per med rec on 8/28 at 1800.  Today, 03/02/18  INR now therapeutic but rose pretty quickly from yesterday - inpatient doses of warfarin so far are 10mg , 12.5mg  and 12.5mg    Hgb 10.8 down some, monitor. Plts stable  No reported bleeding  Lovenox 1mg /kg SQ q12 until INR therapeutic   Goal of Therapy:  INR 2-3 Monitor platelets by anticoagulation protocol: Yes   Plan:  1) Only warfarin 7.5mg  this PM  due to sharp rise in INR overnight 2) Daily INR and CBC 3) Discontinue Lovenox due to INR > 2   Hessie Knows, PharmD, BCPS Pager [phone] 03/02/2018 9:58 AM

## 2018-03-03 LAB — PROTIME-INR
INR: 3.36
Prothrombin Time: 33.7 seconds — ABNORMAL HIGH (ref 11.4–15.2)

## 2018-03-03 LAB — GLUCOSE, CAPILLARY
GLUCOSE-CAPILLARY: 232 mg/dL — AB (ref 70–99)
GLUCOSE-CAPILLARY: 341 mg/dL — AB (ref 70–99)
GLUCOSE-CAPILLARY: 185 mg/dL — AB (ref 70–99)
Glucose-Capillary: 111 mg/dL — ABNORMAL HIGH (ref 70–99)

## 2018-03-03 MED ORDER — METOPROLOL TARTRATE 25 MG PO TABS
25.0000 mg | ORAL_TABLET | Freq: Two times a day (BID) | ORAL | Status: DC
  Administered 2018-03-03 (×2): 25 mg via ORAL
  Filled 2018-03-03: qty 1

## 2018-03-03 MED ORDER — LABETALOL HCL 5 MG/ML IV SOLN
5.0000 mg | INTRAVENOUS | Status: DC | PRN
  Filled 2018-03-03: qty 4

## 2018-03-03 MED ORDER — PREDNISONE 20 MG PO TABS
40.0000 mg | ORAL_TABLET | Freq: Every day | ORAL | Status: DC
  Administered 2018-03-04: 40 mg via ORAL
  Filled 2018-03-03: qty 2

## 2018-03-03 MED ORDER — AZITHROMYCIN 250 MG PO TABS
500.0000 mg | ORAL_TABLET | Freq: Every day | ORAL | Status: DC
  Administered 2018-03-03: 500 mg via ORAL
  Filled 2018-03-03: qty 2

## 2018-03-03 NOTE — Progress Notes (Signed)
PROGRESS NOTE    [name]  VWU:[id number] DOB: 1951-06-30 DOA: 02/27/2018 PCP: Grace Isaac, MD    Brief Narrative:  [phone] who with hx of DM2, hld, hypothyroid, dementia, COPD presents with increased cough, sob, wheezing. Patient was admitted for further work up and treatment of COPD exacerbation  Assessment & Plan:   Principal Problem:   COPD exacerbation (HCC) Active Problems:   Diabetes (HCC)  Dyspnea, cough secondary to Copd exacerbation and RLL PNA -Started on Solumedrol 80mg  iv q8h with azithromycin and rocephin -Pt had also been continued on scheduled bronchodilators -Patient has been weaned to room air  -Tolerating abx. Weaning steroids to prednisone  Afib with RVR -Troponin serially negative -CTA chest neg for PE -ordered 2d echo -TSH within normal limits -Improved with beta blocker -Continue scheduled beta blocker as patient tolerates -Cont coumadin pharmacy to dose -HR brady overnight, metoprolol was briefly held, HR up to 130's later this AM -Continued metoprolol as tolerated  Hypothyroidism -TSH within normal lmits -Will continue with thyroid replacement as tolerated  Anxiety, Mood disorder -Continue with Zoloft 200mg  po qday -Will continue on Risperdal 2mg  po qhs -Continuing with Navane -Will continue with Aricept as tolerated -Stable at this time  Bph -Cont Flomax as tolerated -Presently stable  Hyperlipidemia -Continue on statin as tolerated -Stable at this time  Diabetes Mellitus 2 -Very poorly controlled in setting of steroid use -continued metformin and home lantus -Continue on scheduled meal coverage -continue on SSI coverage as needed -Glucose trends are improving with steroid wean  DVT prophylaxis: coumadin, lovenox bridge Code Status: Full Family Communication: Pt in room, family not at bedside Disposition Plan: SNF, possible in 24-48hrs  Consultants:   Procedures:     Antimicrobials: Anti-infectives  (From admission, onward)   Start     Dose/Rate Route Frequency Ordered Stop   03/03/18 2200  azithromycin (ZITHROMAX) tablet 500 mg     500 mg Oral Daily at bedtime 03/03/18 0843     02/28/18 1500  cefTRIAXone (ROCEPHIN) 1 g in sodium chloride 0.9 % 100 mL IVPB     1 g 200 mL/hr over 30 Minutes Intravenous Every 24 hours 02/28/18 1352     02/27/18 2330  azithromycin (ZITHROMAX) 500 mg in sodium chloride 0.9 % 250 mL IVPB  Status:  Discontinued     500 mg 250 mL/hr over 60 Minutes Intravenous Daily at bedtime 02/27/18 2322 03/03/18 0843      Subjective: Reports feeling tired this AM  Objective: Vitals:   03/03/18 0451 03/03/18 0801 03/03/18 0841 03/03/18 1148  BP:   105/68 (!) 139/101  Pulse:   60 (!) 134  Resp:      Temp:      TempSrc:      SpO2:  95%    Weight: 99.9 kg     Height:        Intake/Output Summary (Last 24 hours) at 03/03/2018 1247 Last data filed at 03/03/2018 0730 Gross per 24 hour  Intake 1434.7 ml  Output 900 ml  Net 534.7 ml   Filed Weights   03/02/18 0440 03/03/18 0430 03/03/18 0451  Weight: 99.6 kg 99.9 kg 99.9 kg    Examination: General exam: Awake, laying in bed, in nad Respiratory system: Normal respiratory effort, no wheezing Cardiovascular system: regular rate, s1, s2 Gastrointestinal system: Soft, nondistended, positive BS Central nervous system: CN2-12 grossly intact, strength intact Extremities: Perfused, no clubbing Skin: Normal skin turgor, no notable skin lesions seen Psychiatry: Mood normal // no  visual hallucinations    Data Reviewed: I have personally reviewed following labs and imaging studies  CBC: Recent Labs  Lab 02/27/18 1923 02/28/18 0435 03/01/18 0150  WBC 12.6* 12.4* 10.4  NEUTROABS 10.6*  --   --   HGB 12.2* 12.1* 10.8*  HCT 35.8* 36.0* 32.4*  MCV 89.7 90.9 92.6  PLT 165 153 165   Basic Metabolic Panel: Recent Labs  Lab 02/27/18 1923 02/28/18 0435 02/28/18 1331 03/01/18 0150 03/02/18 0516  NA 134* 136  137 136 141  K 4.5 4.6 4.5 4.5 4.4  CL 103 107 108 106 111  CO2 19* 22 20* 20* 20*  GLUCOSE 314* 468* 541* 410* 271*  BUN 21 21 24* 28* 22  CREATININE 0.92 0.77 0.85 0.70 0.59*  CALCIUM 8.8* 8.9 8.9 8.4* 8.5*   GFR: Estimated Creatinine Clearance: 107.9 mL/min (A) (by C-G formula based on SCr of 0.59 mg/dL (L)). Liver Function Tests: Recent Labs  Lab 02/28/18 0435 03/01/18 0150  AST 16 13*  ALT 18 16  ALKPHOS 70 58  BILITOT 1.0 0.6  PROT 6.3* 5.6*  ALBUMIN 2.7* 2.5*   No results for input(s): LIPASE, AMYLASE in the last 168 hours. No results for input(s): AMMONIA in the last 168 hours. Coagulation Profile: Recent Labs  Lab 02/27/18 1923 02/28/18 0435 03/01/18 0150 03/02/18 0516 03/03/18 0421  INR 1.13 1.26 1.45 2.37 3.36   Cardiac Enzymes: Recent Labs  Lab 02/27/18 1923 02/28/18 0139 02/28/18 0731 02/28/18 1331 03/01/18 0150  TROPONINI <0.03 <0.03 <0.03 <0.03 <0.03   BNP (last 3 results) No results for input(s): PROBNP in the last 8760 hours. HbA1C: No results for input(s): HGBA1C in the last 72 hours. CBG: Recent Labs  Lab 03/02/18 1201 03/02/18 1617 03/02/18 1957 03/03/18 0720 03/03/18 1126  GLUCAP 307* 232* 190* 111* 232*   Lipid Profile: No results for input(s): CHOL, HDL, LDLCALC, TRIG, CHOLHDL, LDLDIRECT in the last 72 hours. Thyroid Function Tests: No results for input(s): TSH, T4TOTAL, FREET4, T3FREE, THYROIDAB in the last 72 hours. Anemia Panel: No results for input(s): VITAMINB12, FOLATE, FERRITIN, TIBC, IRON, RETICCTPCT in the last 72 hours. Sepsis Labs: No results for input(s): PROCALCITON, LATICACIDVEN in the last 168 hours.  Recent Results (from the past 240 hour(s))  Urine culture     Status: Abnormal   Collection Time: 02/27/18  7:23 PM  Result Value Ref Range Status   Specimen Description   Final    URINE, CLEAN CATCH Performed at [facility], [address] [place], [place] [address]    Special Requests    Final    NONE Performed at [facility], [address] [place], [place] [address]    Culture (A)  Final    <10,000 COLONIES/mL INSIGNIFICANT GROWTH Performed at [facility], [address], [place], [place] [address]    Report Status 03/01/2018 FINAL  Final     Radiology Studies: No results found.  Scheduled Meds: . albuterol  2.5 mg Nebulization BID  . atorvastatin  40 mg Oral q1800  . azithromycin  500 mg Oral QHS  . donepezil  10 mg Oral Daily  . insulin aspart  0-20 Units Subcutaneous TID WC  . insulin aspart  0-5 Units Subcutaneous QHS  . insulin aspart  10 Units Subcutaneous TID WC  . insulin glargine  60 Units Subcutaneous QHS  . levothyroxine  25 mcg Oral QAC breakfast  . metFORMIN  500 mg Oral BID WC  . methylPREDNISolone (SOLU-MEDROL)  injection  40 mg Intravenous Daily  . metoprolol tartrate  25 mg Oral BID  . risperidone  2 mg Oral QHS  . sertraline  200 mg Oral Daily  . tamsulosin  0.4 mg Oral QHS  . thiothixene  5 mg Oral QHS  . tiotropium  18 mcg Inhalation Daily  . Warfarin - Pharmacist Dosing Inpatient   Does not apply q1800   Continuous Infusions: . sodium chloride 75 mL/hr at 03/03/18 0600  . cefTRIAXone (ROCEPHIN)  IV Stopped (03/02/18 1640)     LOS: 3 days   Rickey Barbara, MD [facility] Pager On Amion  If 7PM-7AM, please contact night-coverage 03/03/2018, 12:47 PM

## 2018-03-03 NOTE — Plan of Care (Signed)
  Problem: Education: Goal: Knowledge of General Education information will improve Description: Including pain rating scale, medication(s)/side effects and non-pharmacologic comfort measures Outcome: Progressing   Problem: Health Behavior/Discharge Planning: Goal: Ability to manage health-related needs will improve Outcome: Progressing   Problem: Clinical Measurements: Goal: Ability to maintain clinical measurements within normal limits will improve Outcome: Progressing   Problem: Elimination: Goal: Will not experience complications related to urinary retention Outcome: Progressing   Problem: Pain Managment: Goal: General experience of comfort will improve Outcome: Progressing   Problem: Safety: Goal: Ability to remain free from injury will improve Outcome: Progressing   Problem: Skin Integrity: Goal: Risk for impaired skin integrity will decrease Outcome: Progressing   

## 2018-03-03 NOTE — Progress Notes (Signed)
  Speech Language Pathology Treatment: Dysphagia  Patient Details Name: IZEA LIVOLSI MRN: [id number] DOB: 1950/12/16 Today's Date: 03/03/2018 Time: 0102-7253 SLP Time Calculation (min) (ACUTE ONLY): 12 min  Assessment / Plan / Recommendation Clinical Impression  Pt presents with no overt s/s of aspiration; coordinates respiration and swallow cycles without difficulty. BS are diminished, clear.  Completes three oz water challenge without deficit; adequate mastication.  No signs of current swallow function contributing to respiratory status.  No further SLP f/u needed - our services will sign off.   HPI HPI:  67 y.o. male, w hypertension, hyperlipidemia, dm2, hypothyroidism, dementia, Pafib, Copd presents with cough, dry.  + dyspnea and wheezing        SLP Plan  All goals met       Recommendations  Diet recommendations: Regular;Thin liquid Liquids provided via: Cup;Straw Medication Administration: Whole meds with liquid Supervision: Patient able to self feed                Oral Care Recommendations: Oral care BID SLP Visit Diagnosis: Dysphagia, unspecified (R13.10) Plan: All goals met       GO                Juan Quam Laurice 03/03/2018, 3:19 PM

## 2018-03-03 NOTE — Evaluation (Signed)
Physical Therapy Evaluation Patient Details Name: [name] MRN: [id number] DOB: [birth year] Today's Date: 03/03/2018   History of Present Illness  67yo who with hx of DM2, hld, hypothyroid, dementia, COPD presents 02/27/18  with increased cough, sob, wheezing. Patient was admitted for further work up and treatment of COPD exacerbation, hypoxic in ED.  Clinical Impression  The patient  Was cooperative in evaluation. Ambulated with 1 assist and RW x 50' No family present for Prior lvel of function. Pt admitted with above diagnosis. Pt currently with functional limitations due to the deficits listed below (see PT Problem List).  Pt will benefit from skilled PT to increase their independence and safety with mobility to allow discharge to the venue listed below.         Follow Up Recommendations SNF;Supervision/Assistance - 24 hour    Equipment Recommendations  None recommended by PT    Recommendations for Other Services       Precautions / Restrictions Precautions Precaution Comments: diarrhea Restrictions Weight Bearing Restrictions: No      Mobility  Bed Mobility Overal bed mobility: Independent                Transfers Overall transfer level: Needs assistance Equipment used: Rolling walker (2 wheeled) Transfers: Sit to/from Stand Sit to Stand: Min assist            Ambulation/Gait Ambulation/Gait assistance: Min assist Gait Distance (Feet): 50 Feet Assistive device: Rolling walker (2 wheeled) Gait Pattern/deviations: Step-through pattern     General Gait Details: gait with Rw is steady with 1 assistance, did not attempt without.  Stairs            Wheelchair Mobility    Modified Rankin (Stroke Patients Only)       Balance Overall balance assessment: Needs assistance   Sitting balance-Leahy Scale: Normal     Standing balance support: During functional activity;Bilateral upper extremity supported Standing balance-Leahy Scale: Fair                                Pertinent Vitals/Pain Pain Assessment: No/denies pain    Home Living Family/patient expects to be discharged to:: Private residence Living Arrangements: Spouse/significant other               Additional Comments: unsure, no family present. patient not reliable. ED notes indicates HH was reason for admission. ? from homewith wife.    Prior Function           Comments: unsure     Hand Dominance        Extremity/Trunk Assessment   Upper Extremity Assessment Upper Extremity Assessment: Generalized weakness    Lower Extremity Assessment Lower Extremity Assessment: Generalized weakness    Cervical / Trunk Assessment Cervical / Trunk Assessment: Normal  Communication      Cognition Arousal/Alertness: Awake/alert Behavior During Therapy: WFL for tasks assessed/performed Overall Cognitive Status: No family/caregiver present to determine baseline cognitive functioning                                 General Comments: patient referred to calendar for date, stated at [facility] and it is Labor day      General Comments      Exercises     Assessment/Plan    PT Assessment Patient needs continued PT services  PT Problem List Decreased activity tolerance;Decreased balance;Decreased mobility;Decreased  cognition       PT Treatment Interventions DME instruction;Therapeutic exercise;Gait training;Functional mobility training;Therapeutic activities;Patient/family education;Cognitive remediation    PT Goals (Current goals can be found in the Care Plan section)  Acute Rehab PT Goals Patient Stated Goal: did not state PT Goal Formulation: Patient unable to participate in goal setting Time For Goal Achievement: 03/17/18 Potential to Achieve Goals: Good    Frequency Min 2X/week   Barriers to discharge   unsure- will need 24/7 assistance    Co-evaluation               AM-PAC PT "6 Clicks" Daily Activity   Outcome Measure Difficulty turning over in bed (including adjusting bedclothes, sheets and blankets)?: None Difficulty moving from lying on back to sitting on the side of the bed? : None Difficulty sitting down on and standing up from a chair with arms (e.g., wheelchair, bedside commode, etc,.)?: A Little Help needed moving to and from a bed to chair (including a wheelchair)?: A Little Help needed walking in hospital room?: A Lot Help needed climbing 3-5 steps with a railing? : Total 6 Click Score: 17    End of Session Equipment Utilized During Treatment: Gait belt Activity Tolerance: Patient tolerated treatment well Patient left: in chair;with call bell/phone within reach;with chair alarm set Nurse Communication: Mobility status PT Visit Diagnosis: Unsteadiness on feet (R26.81)    Time: 7416-3845 PT Time Calculation (min) (ACUTE ONLY): 17 min   Charges:   PT Evaluation $PT Eval Low Complexity: 1 Low          [place] PT [phone]   Rada Hay 03/03/2018, 10:24 AM

## 2018-03-03 NOTE — Progress Notes (Addendum)
ANTICOAGULATION CONSULT NOTE - follow up  Pharmacy Consult for warfarin Indication: atrial fibrillation  No Known Allergies  Patient Measurements: Height: 5\' 11"  (180.3 cm) Weight: 220 lb 3.8 oz (99.9 kg) IBW/kg (Calculated) : 75.3 Heparin Dosing Weight:   Vital Signs: Temp: 98.7 F (37.1 C) (09/02 0430) Temp Source: Oral (09/02 0430) BP: 105/68 (09/02 0841) Pulse Rate: 60 (09/02 0841)  Labs: Recent Labs    02/28/18 1331 03/01/18 0150 03/02/18 0516 03/03/18 0421  HGB  --  10.8*  --   --   HCT  --  32.4*  --   --   PLT  --  165  --   --   LABPROT  --  17.5* 25.7* 33.7*  INR  --  1.45 2.37 3.36  CREATININE 0.85 0.70 0.59*  --   TROPONINI <0.03 <0.03  --   --     Estimated Creatinine Clearance: 107.9 mL/min (A) (by C-G formula based on SCr of 0.59 mg/dL (L)).   Medical History: Past Medical History:  Diagnosis Date  . Anxiety   . Atrial fibrillation (HCC)   . Bipolar disorder (HCC)   . COPD (chronic obstructive pulmonary disease) (HCC)   . Dementia   . Depression   . Diabetes mellitus   . Diabetes mellitus without complication (HCC)   . High cholesterol   . Hyperlipidemia   . Hypothyroidism     Assessment: 67 yo male admitted with weakness, AECOPD on warfarin prior to admission for afib at a dose of 10mg  daily.  INR on admit 1.13.  Last dose of warfarin noted per med rec on 8/28 at 1800.  Today, 03/03/18  INR now SUPRAtherapeutic and rose pretty quickly from yesterday - inpatient doses of warfarin so far are 10mg , 12.5mg , 12.5mg , and 7.5mg . Likely secondary to higher doses of 12.5mg  given  Hgb 10.8 down some, monitor. Plts stable - recheck 9/3  No reported bleeding  Lovenox 1mg /kg SQ q12 until INR therapeutic   Goal of Therapy:  INR 2-3 Monitor platelets by anticoagulation protocol: Yes   Plan:  1) No warfarin today due supratherapeutic INR 2) Daily INR and CBC   Hessie Knows, PharmD, BCPS Pager [phone] 03/03/2018 8:43 AM

## 2018-03-04 LAB — BASIC METABOLIC PANEL
ANION GAP: 11 (ref 5–15)
BUN: 18 mg/dL (ref 8–23)
CHLORIDE: 110 mmol/L (ref 98–111)
CO2: 23 mmol/L (ref 22–32)
Calcium: 8.8 mg/dL — ABNORMAL LOW (ref 8.9–10.3)
Creatinine, Ser: 0.56 mg/dL — ABNORMAL LOW (ref 0.61–1.24)
GFR calc Af Amer: >60 mL/min (ref >60)
GLUCOSE: 134 mg/dL — AB (ref 70–99)
POTASSIUM: 3.6 mmol/L (ref 3.5–5.1)
Sodium: 144 mmol/L (ref 135–145)

## 2018-03-04 LAB — CBC
HEMATOCRIT: 38.7 % — AB (ref 39.0–52.0)
HEMOGLOBIN: 12.9 g/dL — AB (ref 13.0–17.0)
MCH: 30.4 pg (ref 26.0–34.0)
MCHC: 33.3 g/dL (ref 30.0–36.0)
MCV: 91.3 fL (ref 78.0–100.0)
Platelets: 189 10*3/uL (ref 150–400)
RBC: 4.24 MIL/uL (ref 4.22–5.81)
RDW: 14.7 % (ref 11.5–15.5)
WBC: 9.1 10*3/uL (ref 4.0–10.5)

## 2018-03-04 LAB — PROTIME-INR
INR: 2.66
Prothrombin Time: 28.1 seconds — ABNORMAL HIGH (ref 11.4–15.2)

## 2018-03-04 LAB — GLUCOSE, CAPILLARY
GLUCOSE-CAPILLARY: 206 mg/dL — AB (ref 70–99)
Glucose-Capillary: 109 mg/dL — ABNORMAL HIGH (ref 70–99)
Glucose-Capillary: 275 mg/dL — ABNORMAL HIGH (ref 70–99)
Glucose-Capillary: 205 mg/dL — ABNORMAL HIGH (ref 70–99)

## 2018-03-04 MED ORDER — CEFDINIR 300 MG PO CAPS: 300.0000 mg | ORAL_CAPSULE | Freq: Two times a day (BID) | ORAL | 0 refills | Status: AC

## 2018-03-04 MED ORDER — AZITHROMYCIN 250 MG PO TABS: ORAL_TABLET | ORAL | 0 refills | Status: AC

## 2018-03-04 MED ORDER — TIOTROPIUM BROMIDE MONOHYDRATE 18 MCG IN CAPS: 18.0000 ug | ORAL_CAPSULE | Freq: Every day | RESPIRATORY_TRACT | 0 refills | Status: AC

## 2018-03-04 MED ORDER — INSULIN ASPART 100 UNIT/ML FLEXPEN: 8.0000 [IU] | PEN_INJECTOR | Freq: Three times a day (TID) | SUBCUTANEOUS | 0 refills | Status: AC

## 2018-03-04 MED ORDER — ALBUTEROL SULFATE HFA 108 (90 BASE) MCG/ACT IN AERS: 2.0000 | INHALATION_SPRAY | RESPIRATORY_TRACT | 2 refills | Status: DC | PRN

## 2018-03-04 MED ORDER — PREDNISONE 10 MG PO TABS: 10.0000 mg | ORAL_TABLET | Freq: Every day | ORAL | Status: DC

## 2018-03-04 MED ORDER — WARFARIN SODIUM 5 MG PO TABS
10.0000 mg | ORAL_TABLET | Freq: Once | ORAL | Status: AC
  Administered 2018-03-04: 10 mg via ORAL
  Filled 2018-03-04: qty 2

## 2018-03-04 MED ORDER — METOPROLOL TARTRATE 25 MG PO TABS
37.5000 mg | ORAL_TABLET | Freq: Two times a day (BID) | ORAL | Status: DC
  Administered 2018-03-04: 37.5 mg via ORAL
  Filled 2018-03-04: qty 2

## 2018-03-04 NOTE — Progress Notes (Signed)
Physical Therapy Treatment Patient Details Name: [name] MRN: [id number] DOB: 1950/12/05 Today's Date: 03/04/2018    History of Present Illness 67yo who with hx of DM2, hld, hypothyroid, dementia, COPD presents 02/27/18  with increased cough, sob, wheezing. Patient was admitted for further work up and treatment of COPD exacerbation, hypoxic in ED.    PT Comments    Assisted OOB to amb a limited distance due to c/o weakness/fatigue.  Amb unsteady and required need for a walker.  Prior pt used a cane "sometimes".  Pt will need ST Rehab at SNF to regain prior level of mobility and indep as spouse has MS and is "bed bound" per pt.   Follow Up Recommendations  SNF;Supervision/Assistance - 24 hour     Equipment Recommendations  None recommended by PT    Recommendations for Other Services       Precautions / Restrictions Precautions Precautions: Fall Precaution Comments: monitor stas Restrictions Weight Bearing Restrictions: No    Mobility  Bed Mobility Overal bed mobility: Independent             General bed mobility comments: able self with bed controls and increased time   Transfers Overall transfer level: Needs assistance Equipment used: None Transfers: Sit to/from [employer] Sit to Stand: Min assist Stand pivot transfers: Min assist       General transfer comment: mild unsteady with need to support self B UE's and 25% VC's on safety with turns   Ambulation/Gait Ambulation/Gait assistance: Min assist Gait Distance (Feet): 45 Feet Assistive device: Rolling walker (2 wheeled);None Gait Pattern/deviations: Step-through pattern;Trunk flexed;Shuffle Gait velocity: decreased   General Gait Details: amb with and without AD.  HHA required Mod assist to prevent forward LOB.  Unsteady with need for walker for support.     Stairs             Wheelchair Mobility    Modified Rankin (Stroke Patients Only)       Balance                                             Cognition Arousal/Alertness: Awake/alert Behavior During Therapy: WFL for tasks assessed/performed Overall Cognitive Status: Within Functional Limits for tasks assessed                                 General Comments: AxO x 3 pleasant      Exercises      General Comments        Pertinent Vitals/Pain Pain Assessment: Faces Faces Pain Scale: Hurts a little bit Pain Location: all over "stiffness"    Home Living                      Prior Function            PT Goals (current goals can now be found in the care plan section) Progress towards PT goals: Progressing toward goals    Frequency    Min 2X/week      PT Plan Current plan remains appropriate    Co-evaluation              AM-PAC PT "6 Clicks" Daily Activity  Outcome Measure  Difficulty turning over in bed (including adjusting bedclothes, sheets and blankets)?: A Little Difficulty moving from  lying on back to sitting on the side of the bed? : A Little Difficulty sitting down on and standing up from a chair with arms (e.g., wheelchair, bedside commode, etc,.)?: A Little Help needed moving to and from a bed to chair (including a wheelchair)?: A Little Help needed walking in hospital room?: A Little Help needed climbing 3-5 steps with a railing? : A Lot 6 Click Score: 17    End of Session Equipment Utilized During Treatment: Gait belt Activity Tolerance: Patient tolerated treatment well Patient left: in bed;with bed alarm set;with call bell/phone within reach Nurse Communication: Mobility status PT Visit Diagnosis: Unsteadiness on feet (R26.81)     Time: 0912-0930 PT Time Calculation (min) (ACUTE ONLY): 18 min  Charges:  $Gait Training: 8-22 mins                     Felecia Shelling  PTA [facility]  Acute  Rehab Pager      [phone]

## 2018-03-04 NOTE — Progress Notes (Signed)
Pt admitting to [facility]- report [phone] [facility]tar transport arranged PASRR #[id number] C DC information provided via the HUB. Pt's wife aware and agreeable  Ilean Skill, MSW, LCSW Clinical Social Work 03/04/2018 [phone] coverage for [phone]

## 2018-03-04 NOTE — Care Management Note (Signed)
Case Management Note  Patient Details  Name: [name] MRN: [id number] Date of Birth: 02-01-1951  Subjective/Objective:                    Action/Plan:   Expected Discharge Date:  03/04/18               Expected Discharge Plan:  Skilled Nursing Facility  In-House Referral:  Clinical Social Work  Discharge planning Services  CM Consult  Post Acute Care Choice:    Choice offered to:     DME Arranged:    DME Agency:     HH Arranged:    HH Agency:     Status of Service:  Completed, signed off  If discussed at [employer], dates discussed:    Additional CommentsGeni Bers, RN 03/04/2018, 12:45 PM

## 2018-03-04 NOTE — Discharge Summary (Signed)
Physician Discharge Summary  TAB RYLEE ZOX:[id number] DOB: [birth year] DOA: 02/27/2018  PCP: Grace Isaac, MD  Admit date: 02/27/2018 Discharge date: 03/04/2018  Admitted From: Home Disposition:  SNF  Recommendations for Outpatient Follow-up:  1. Follow up with PCP in 1-2 weeks 2. Continue to dose coumadin to goal INR of 2-3 3. Complete 3 more days of antibiotics 4. Most recent INR of 2.66 on 9/3 5. Please continue to titrate insulin to achieve euglycemia  Discharge Condition:Improved CODE STATUS:Full Diet recommendation: Diabetic   Brief/Interim Summary: 67yo who with hx of DM2, hld, hypothyroid, dementia, COPD presents with increased cough, sob, wheezing. Patient was admitted for further work up and treatment of COPD exacerbation  Dyspnea, cough secondary to Copd exacerbation and RLL PNA -Initially started on Solumedrol 80mg  iv q8h with azithromycin and rocephin -Pt had also been continued on scheduled bronchodilators -Patient has been weaned to room air  -Tolerating abx. Weaning steroids to prednisone -Complete 3 more days of azithromycin and omnicef on discharge  Afib with RVR -Troponin serially negative -CTA chest neg for PE -ordered 2d echo -TSH within normal limits -Improved with beta blocker -Continue scheduled beta blocker as patient tolerates -Cont coumadin pharmacy to dose -HR improved with metoprolol  Hypothyroidism -TSH within normal lmits -Will continue with thyroid replacement as tolerated  Anxiety, Mood disorder -Continue with Zoloft 200mg  po qday -Will continue on Risperdal 2mg  po qhs -Continuing with Navane -Will continue with Aricept as tolerated -Stable at this time  Bph -Cont Flomax as tolerated -Presently stable  Hyperlipidemia -Continue on statin as tolerated -Stable at this time  Diabetes Mellitus 2 -Initially found to be very poorly controlled in setting of steroid use -A1c of 10.8 noted -continued metformin and  home lantus -Continue on scheduled meal coverage -continue on SSI coverage as needed -Glucose trends are improving with steroid wean -will discharge on lantus, scheduled meal coverage with continued metformin.   Discharge Diagnoses:  Principal Problem:   COPD exacerbation (HCC) Active Problems:   Diabetes [facility])    Discharge Instructions   Allergies as of 03/04/2018   No Known Allergies     Medication List    STOP taking these medications   glipiZIDE 5 MG tablet Commonly known as:  GLUCOTROL     TAKE these medications   albuterol 108 (90 Base) MCG/ACT inhaler Commonly known as:  PROVENTIL HFA;VENTOLIN HFA Inhale 2 puffs into the lungs every 4 (four) hours as needed for wheezing or shortness of breath.   azithromycin 250 MG tablet Commonly known as:  ZITHROMAX One tab PO daily for 3 more days, zero refills   cefdinir 300 MG capsule Commonly known as:  OMNICEF Take 1 capsule (300 mg total) by mouth 2 (two) times daily for 3 days.   donepezil 10 MG tablet Commonly known as:  ARICEPT Take 10 mg by mouth daily.   insulin aspart 100 UNIT/ML FlexPen Commonly known as:  NOVOLOG Inject 8 Units into the skin 3 (three) times daily with meals.   insulin glargine 100 UNIT/ML injection Commonly known as:  LANTUS Inject 0.4 mLs (40 Units total) into the skin at bedtime. Inject 40 units daily increasing by 4 units every 3 days until blood glucose level is less than 120mg /dL   levothyroxine 25 MCG tablet Commonly known as:  SYNTHROID, LEVOTHROID Take 25 mcg by mouth daily before breakfast.   metFORMIN 500 MG tablet Commonly known as:  GLUCOPHAGE Take 500 mg by mouth 2 (two) times daily with a meal.  metoprolol tartrate 25 MG tablet Commonly known as:  LOPRESSOR Take 1.5 tablets (37.5 mg total) by mouth 2 (two) times daily.   predniSONE 10 MG tablet Commonly known as:  DELTASONE Take 1 tablet (10 mg total) by mouth daily. Taper dose: 40mg  po daily x 2 days, then 20mg   po daily x 2 days, then 10mg  po daily x 2 days, then 5mg  po daily x 2 days, then stop. Zero refills   risperidone 4 MG tablet Commonly known as:  RISPERDAL Take 2 mg by mouth at bedtime.   sertraline 100 MG tablet Commonly known as:  ZOLOFT Take 200 mg by mouth daily.   simvastatin 80 MG tablet Commonly known as:  ZOCOR Take 80 mg by mouth daily.   tamsulosin 0.4 MG Caps capsule Commonly known as:  FLOMAX Take 0.4 mg by mouth at bedtime.   thiothixene 5 MG capsule Commonly known as:  NAVANE Take 5 mg by mouth at bedtime.   tiotropium 18 MCG inhalation capsule Commonly known as:  SPIRIVA Place 1 capsule (18 mcg total) into inhaler and inhale daily. Start taking on:  03/05/2018   warfarin 5 MG tablet Commonly known as:  COUMADIN Take 10 mg by mouth daily at 6 PM.      Follow-up Information    Grace Isaac, MD. Schedule an appointment as soon as possible for a visit in 1 week(s).   Specialty:  Internal Medicine Contact information: [address] [place] [address] [phone]          No Known Allergies   Procedures/Studies: Dg Chest 2 View  Result Date: 02/27/2018 CLINICAL DATA:  New onset weakness and cough. EXAM: CHEST - 2 VIEW COMPARISON:  05/01/2011 FINDINGS: Stable cardiomegaly with mild aortic atherosclerosis. No acute pulmonary consolidation or CHF. Chronic mild bronchitic change of the lungs with increased interstitial lung markings and peribronchial thickening. No effusion or pneumothorax. Osteoarthritis of the [facility] and glenohumeral joints. Degenerative changes are present along the dorsal spine without significant change. IMPRESSION: Chronic bronchitic change of the lungs. Electronically Signed   By: Tollie Eth M.D.   On: 02/27/2018 20:02   Ct Angio Chest Pe W Or Wo Contrast  Result Date: 02/28/2018 CLINICAL DATA:  67 year old male with shortness of breath and chest pain. Elevated D-dimer. EXAM: CT ANGIOGRAPHY CHEST WITH CONTRAST TECHNIQUE:  Multidetector CT imaging of the chest was performed using the standard protocol during bolus administration of intravenous contrast. Multiplanar CT image reconstructions and MIPs were obtained to evaluate the vascular anatomy. CONTRAST:  ISOVUE-370 IOPAMIDOL (ISOVUE-370) INJECTION 76% COMPARISON:  Chest radiograph dated 02/27/2018 FINDINGS: Cardiovascular: Borderline cardiomegaly. There is a 3 vessel coronary vascular calcification. Mild atherosclerotic calcification of the thoracic aorta. No aneurysmal dilatation or dissection. Evaluation of the pulmonary arteries is somewhat limited due to respiratory motion artifact and suboptimal visualization of the distal and peripheral branches. No large or central pulmonary artery embolus identified. Mediastinum/Nodes: No hilar or mediastinal adenopathy. Esophagus is grossly unremarkable. No mediastinal fluid collection. Lungs/Pleura: There is a large area of consolidation in the right lower lobe most consistent with pneumonia. Clinical correlation and follow-up to resolution is recommended to exclude underlying mass. There is no pleural effusion or pneumothorax. There is occlusion of the right lower lobe bronchus. 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 Upper Abdomen: Fatty liver with morphologic changes of cirrhosis. There is a 4 cm rim calcified stone in the gallbladder. There is splenomegaly. The spleen is only partially visualized. Musculoskeletal: No chest wall abnormality. No acute  or significant osseous findings. Review of the MIP images confirms the above findings. IMPRESSION: 1. No CT evidence of central pulmonary artery embolus. 2. Large area of consolidation involving the right lower lobe most consistent with pneumonia. Clinical correlation and follow-up to resolution recommended. There is occlusion of the right lower lobe bronchus which may be related to mucous impaction although an endobronchial lesion is not entirely excluded. 3. Mild cardiomegaly and coronary  vascular calcification. 4. Cirrhosis, cholelithiasis, and splenomegaly. Electronically Signed   By: Elgie Collard M.D.   On: 02/28/2018 06:13    Subjective: Without complaints  Discharge Exam: Vitals:   03/04/18 0527 03/04/18 0748  BP: 112/86   Pulse: (!) 101   Resp: 17   Temp: 98.3 F (36.8 C)   SpO2: 96% 93%   Vitals:   03/03/18 2007 03/04/18 0500 03/04/18 0527 03/04/18 0748  BP: 115/76  112/86   Pulse: (!) 118  (!) 101   Resp: 18  17   Temp: 98.2 F (36.8 C)  98.3 F (36.8 C)   TempSrc: Oral  Oral   SpO2: 94%  96% 93%  Weight:  96.1 kg    Height:        General: Pt is alert, awake, not in acute distress Cardiovascular: RRR, S1/S2 +, no rubs, no gallops Respiratory: CTA bilaterally, no wheezing, no rhonchi Abdominal: Soft, NT, ND, bowel sounds + Extremities: no edema, no cyanosis   The results of significant diagnostics from this hospitalization (including imaging, microbiology, ancillary and laboratory) are listed below for reference.     Microbiology: Recent Results (from the past 240 hour(s))  Urine culture     Status: Abnormal   Collection Time: 02/27/18  7:23 PM  Result Value Ref Range Status   Specimen Description   Final    URINE, CLEAN CATCH Performed at [facility], [address] [place], [place] [address]    Special Requests   Final    NONE Performed at [facility], [address] [place], [place] [address]    Culture (A)  Final    <10,000 COLONIES/mL INSIGNIFICANT GROWTH Performed at [facility], [address] [place], [place] [address]    Report Status 03/01/2018 FINAL  Final     Labs: BNP (last 3 results) Recent Labs    02/27/18 1923  BNP 157.7*   Basic Metabolic Panel: Recent Labs  Lab 02/28/18 0435 02/28/18 1331 03/01/18 0150 03/02/18 0516 03/04/18 0418  NA 136 137 136 141 144  K 4.6 4.5 4.5 4.4 3.6  CL 107 108 106 111 110  CO2 22 20* 20* 20* 23  GLUCOSE 468* 541*  410* 271* 134*  BUN 21 24* 28* 22 18  CREATININE 0.77 0.85 0.70 0.59* 0.56*  CALCIUM 8.9 8.9 8.4* 8.5* 8.8*   Liver Function Tests: Recent Labs  Lab 02/28/18 0435 03/01/18 0150  AST 16 13*  ALT 18 16  ALKPHOS 70 58  BILITOT 1.0 0.6  PROT 6.3* 5.6*  ALBUMIN 2.7* 2.5*   No results for input(s): LIPASE, AMYLASE in the last 168 hours. No results for input(s): AMMONIA in the last 168 hours. CBC: Recent Labs  Lab 02/27/18 1923 02/28/18 0435 03/01/18 0150 03/04/18 0418  WBC 12.6* 12.4* 10.4 9.1  NEUTROABS 10.6*  --   --   --   HGB 12.2* 12.1* 10.8* 12.9*  HCT 35.8* 36.0* 32.4* 38.7*  MCV 89.7 90.9 92.6 91.3  PLT 165 153 165 189   Cardiac  Enzymes: Recent Labs  Lab 02/27/18 1923 02/28/18 0139 02/28/18 0731 02/28/18 1331 03/01/18 0150  TROPONINI <0.03 <0.03 <0.03 <0.03 <0.03   BNP: Invalid input(s): POCBNP CBG: Recent Labs  Lab 03/03/18 1126 03/03/18 1623 03/03/18 2121 03/04/18 0747 03/04/18 1117  GLUCAP 232* 341* 185* 109* 206*   D-Dimer No results for input(s): DDIMER in the last 72 hours. Hgb A1c No results for input(s): HGBA1C in the last 72 hours. Lipid Profile No results for input(s): CHOL, HDL, LDLCALC, TRIG, CHOLHDL, LDLDIRECT in the last 72 hours. Thyroid function studies No results for input(s): TSH, T4TOTAL, T3FREE, THYROIDAB in the last 72 hours.  Invalid input(s): FREET3 Anemia work up No results for input(s): VITAMINB12, FOLATE, FERRITIN, TIBC, IRON, RETICCTPCT in the last 72 hours. Urinalysis    Component Value Date/Time   COLORURINE YELLOW 02/27/2018 1923   APPEARANCEUR CLEAR 02/27/2018 1923   LABSPEC 1.029 02/27/2018 1923   PHURINE 5.0 02/27/2018 1923   GLUCOSEU >=500 (A) 02/27/2018 1923   HGBUR NEGATIVE 02/27/2018 1923   BILIRUBINUR NEGATIVE 02/27/2018 1923   KETONESUR 20 (A) 02/27/2018 1923   PROTEINUR NEGATIVE 02/27/2018 1923   UROBILINOGEN 1.0 04/05/2013 1551   NITRITE NEGATIVE 02/27/2018 1923   LEUKOCYTESUR NEGATIVE  02/27/2018 1923   Sepsis Labs Invalid input(s): PROCALCITONIN,  WBC,  LACTICIDVEN Microbiology Recent Results (from the past 240 hour(s))  Urine culture     Status: Abnormal   Collection Time: 02/27/18  7:23 PM  Result Value Ref Range Status   Specimen Description   Final    URINE, CLEAN CATCH Performed at [facility], [address] [place], [place] [address]    Special Requests   Final    NONE Performed at [facility], [address], [place], [place] [address]    Culture (A)  Final    <10,000 COLONIES/mL INSIGNIFICANT GROWTH Performed at [facility], [address], [place], [place] [address]    Report Status 03/01/2018 FINAL  Final   Time spent:  SIGNED:   Rickey Barbara, MD  [facility] 03/04/2018, 11:56 AM  If 7PM-7AM, please contact night-coverage

## 2018-03-04 NOTE — Progress Notes (Signed)
ANTICOAGULATION CONSULT NOTE - follow up  Pharmacy Consult for warfarin Indication: atrial fibrillation  No Known Allergies  Patient Measurements: Height: 5\' 11"  (180.3 cm) Weight: 211 lb 13.8 oz (96.1 kg) IBW/kg (Calculated) : 75.3 Heparin Dosing Weight:   Vital Signs: Temp: 98.3 F (36.8 C) (09/03 0527) Temp Source: Oral (09/03 0527) BP: 112/86 (09/03 0527) Pulse Rate: 101 (09/03 0527)  Labs: Recent Labs    03/02/18 0516 03/03/18 0421 03/04/18 0418  HGB  --   --  12.9*  HCT  --   --  38.7*  PLT  --   --  189  LABPROT 25.7* 33.7* 28.1*  INR 2.37 3.36 2.66  CREATININE 0.59*  --  0.56*    Estimated Creatinine Clearance: 106 mL/min (A) (by C-G formula based on SCr of 0.56 mg/dL (L)).   Medical History: Past Medical History:  Diagnosis Date  . Anxiety   . Atrial fibrillation (HCC)   . Bipolar disorder (HCC)   . COPD (chronic obstructive pulmonary disease) (HCC)   . Dementia   . Depression   . Diabetes mellitus   . Diabetes mellitus without complication (HCC)   . High cholesterol   . Hyperlipidemia   . Hypothyroidism    Assessment: 67 yo male admitted with weakness, AECOPD on warfarin prior to admission for afib at a dose of 10mg  daily.  INR on admit 1.13.  Last dose of warfarin noted per med rec on 8/28 at 1800.  Today, 03/04/18  INR 2.66 (therapeutic); warfarin held yesterday  Hgb 12.9 down some, monitor. Plts 189  No reported bleeding   Goal of Therapy:  INR 2-3 Monitor platelets by anticoagulation protocol: Yes   Plan:  1) Give warfarin 10 mg to resume home dose  2) Daily INR and CBC   Luis Abed, Student PharmD 03/04/2018 2:00 PM

## 2018-03-04 NOTE — Clinical Social Work Note (Signed)
Clinical Social Work Assessment  Patient Details  Name: [name] MRN: [id number] Date of Birth: 21-Feb-1951  Date of referral:  03/04/18               Reason for consult:  Facility Placement                Permission sought to share information with:  Facility Medical sales representative, Family Supports Permission granted to share information::  Yes, Verbal Permission Granted  Name::     Dawon Sokolowski  Agency::  [phone]  Relationship::  Wife  Contact Information:     Housing/Transportation Living arrangements for the past 2 months:  Single Family Home Source of Information:  Patient Patient Interpreter Needed:  None Criminal Activity/Legal Involvement Pertinent to Current Situation/Hospitalization:  No - Comment as needed Significant Relationships:  Spouse Lives with:  Spouse Do you feel safe going back to the place where you live?  (PT recommending SNF) Need for family participation in patient care:  Yes (Comment)  Care giving concerns:  Patient from home with wife and caregiver. Patient reported that he does not use any durable medical equipment and is usually independent with ADLs, patient's wife confirmed patient's report. PT recommended SNF.    Social Worker assessment / plan:  CSW spoke with patient at bedside regarding PT recommendation for SNF and discharge planning. Patient reported that he is agreeable to SNF if that is the recommendation. CSW explained SNF placement process and medicare coverage, patient verbalized some understanding. Patient granted CSW verbal permission to speak with his wife to discuss discharge planning further.   CSW contacted patient's wife Zakkai Zappa) and discussed PT recommendation for SNF and discharge planning. Patient's wife reported that she is in agreement. CSW agreed to complete patient's FL2 and follow up with bed offers.  CSW completed patient's FL2 and will follow up with bed offers. CSW initiated patient's PASRR, currently  pending. CSW will continue to follow and assist with discharge planning.  Employment status:  Retired Health and safety inspector:  Medicare PT Recommendations:  Skilled Nursing Facility Information / Referral to community resources:  Skilled Nursing Facility  Patient/Family's Response to care:  Patient/patient's wife appreciative of CSW assistance with discharge planning.   Patient/Family's Understanding of and Emotional Response to Diagnosis, Current Treatment, and Prognosis:  Patient presented calm and verbalized understanding of PT recommendation for ST rehab at SNF. Patient's wife involved in patient's care and verbalized understanding of PT recommendation for and SNF and in agreement of patient discharging to SNF for ST rehab. Patient's wife hopeful that patient will get better and return home.   Emotional Assessment Appearance:  Appears stated age Attitude/Demeanor/Rapport:  Other(Cooperative) Affect (typically observed):  Appropriate, Calm Orientation:  Oriented to Self, Oriented to Situation, Oriented to Place Alcohol / Substance use:  Not Applicable Psych involvement (Current and /or in the community):  No (Comment)  Discharge Needs  Concerns to be addressed:  Care Coordination Readmission within the last 30 days:  No Current discharge risk:  Physical Impairment Barriers to Discharge:  Awaiting State Approval ([place])   Antionette Poles, LCSW 03/04/2018, 3:08 PM

## 2018-03-04 NOTE — NC FL2 (Signed)
[place] MEDICAID FL2 LEVEL OF CARE SCREENING TOOL     IDENTIFICATION  Patient Name: [name] Birthdate: September 28, 1950 Sex: male Admission Date (Current Location): 02/27/2018  [facility] and [place] Number:  Producer, television/film/video and Address:  [facility],  [address] [place], [place] [address]      Provider Number: [id number]  Attending Physician Name and Address:  Jerald Kief, MD  Relative Name and Phone Number:  Draxler Severson [phone]    Current Level of Care: Hospital Recommended Level of Care: Skilled Nursing Facility Prior Approval Number:    Date Approved/Denied:   PASRR Number:    Discharge Plan: SNF    Current Diagnoses: Patient Active Problem List   Diagnosis Date Noted  . COPD exacerbation (HCC) 02/27/2018  . Atrial fibrillation (HCC) 04/05/2013  . Diarrhea 04/05/2013  . Orthostatic hypotension 04/05/2013  . Dehydration 04/05/2013  . Diabetes (HCC) 04/05/2013    Orientation RESPIRATION BLADDER Height & Weight     Self, Situation, Place  Normal Incontinent Weight: 211 lb 13.8 oz (96.1 kg) Height:  5\' 11"  (180.3 cm)  BEHAVIORAL SYMPTOMS/MOOD NEUROLOGICAL BOWEL NUTRITION STATUS      Incontinent Diet(see dc summary)  AMBULATORY STATUS COMMUNICATION OF NEEDS Skin   Limited Assist Verbally Normal                       Personal Care Assistance Level of Assistance  Bathing, Feeding, Dressing Bathing Assistance: Limited assistance Feeding assistance: Independent Dressing Assistance: Limited assistance     Functional Limitations Info  Sight, Hearing, Speech Sight Info: Adequate Hearing Info: Adequate Speech Info: Adequate    SPECIAL CARE FACTORS FREQUENCY  PT (By licensed PT), OT (By licensed OT)     PT Frequency: 5x/week OT Frequency: 5x/week            Contractures      Additional Factors Info  Code Status, Allergies Code Status Info: Full Code Allergies Info: NKA           Current Medications  (03/04/2018):  This is the current hospital active medication list Current Facility-Administered Medications  Medication Dose Route Frequency Provider Last Rate Last Dose  . acetaminophen (TYLENOL) tablet 650 mg  650 mg Oral Q6H PRN Pearson Grippe, MD       Or  . acetaminophen (TYLENOL) suppository 650 mg  650 mg Rectal Q6H PRN Pearson Grippe, MD      . albuterol (PROVENTIL) (2.5 MG/3ML) 0.083% nebulizer solution 2.5 mg  2.5 mg Nebulization Q6H PRN Pearson Grippe, MD      . albuterol (PROVENTIL) (2.5 MG/3ML) 0.083% nebulizer solution 2.5 mg  2.5 mg Nebulization BID Jerald Kief, MD   2.5 mg at 03/04/18 0748  . atorvastatin (LIPITOR) tablet 40 mg  40 mg Oral q1800 Pearson Grippe, MD   40 mg at 03/02/18 1744  . azithromycin (ZITHROMAX) tablet 500 mg  500 mg Oral QHS Jerald Kief, MD   500 mg at 03/03/18 2144  . benzonatate (TESSALON) capsule 100 mg  100 mg Oral TID PRN Jerald Kief, MD   100 mg at 03/02/18 0908  . cefTRIAXone (ROCEPHIN) 1 g in sodium chloride 0.9 % 100 mL IVPB  1 g Intravenous Q24H Jerald Kief, MD   Stopped at 03/03/18 1724  . donepezil (ARICEPT) tablet 10 mg  10 mg Oral Daily Pearson Grippe, MD   10 mg at 03/04/18 0945  . guaiFENesin-dextromethorphan (ROBITUSSIN DM) 100-10 MG/5ML syrup 15 mL  15 mL Oral Q4H PRN Jerald Kief, MD      . insulin aspart (novoLOG) injection 0-20 Units  0-20 Units Subcutaneous TID WC Jerald Kief, MD   7 Units at 03/04/18 1127  . insulin aspart (novoLOG) injection 0-5 Units  0-5 Units Subcutaneous QHS Jerald Kief, MD      . insulin aspart (novoLOG) injection 10 Units  10 Units Subcutaneous TID WC Jerald Kief, MD   10 Units at 03/04/18 1129  . insulin glargine (LANTUS) injection 60 Units  60 Units Subcutaneous QHS Jerald Kief, MD   60 Units at 03/03/18 2144  . labetalol (NORMODYNE,TRANDATE) injection 5 mg  5 mg Intravenous Q2H PRN Jerald Kief, MD      . levothyroxine (SYNTHROID, LEVOTHROID) tablet 25 mcg  25 mcg Oral QAC breakfast Pearson Grippe,  MD   25 mcg at 03/04/18 0945  . metFORMIN (GLUCOPHAGE) tablet 500 mg  500 mg Oral BID WC Pearson Grippe, MD   500 mg at 03/04/18 0945  . metoprolol tartrate (LOPRESSOR) tablet 37.5 mg  37.5 mg Oral BID Jerald Kief, MD   37.5 mg at 03/04/18 0944  . predniSONE (DELTASONE) tablet 40 mg  40 mg Oral Q breakfast Jerald Kief, MD   40 mg at 03/04/18 0945  . risperiDONE (RISPERDAL) tablet 2 mg  2 mg Oral QHS Pearson Grippe, MD   2 mg at 03/03/18 2144  . sertraline (ZOLOFT) tablet 200 mg  200 mg Oral Daily Pearson Grippe, MD   200 mg at 03/04/18 0945  . tamsulosin (FLOMAX) capsule 0.4 mg  0.4 mg Oral QHS Pearson Grippe, MD   0.4 mg at 03/03/18 2144  . thiothixene (NAVANE) capsule 5 mg  5 mg Oral QHS Pearson Grippe, MD   5 mg at 03/03/18 2144  . tiotropium (SPIRIVA) inhalation capsule 18 mcg  18 mcg Inhalation Daily Pearson Grippe, MD   18 mcg at 03/04/18 0748  . Warfarin - Pharmacist Dosing Inpatient   Does not apply q1800 Pearson Grippe, MD   Stopped at 03/03/18 1800     Discharge Medications: Please see discharge summary for a list of discharge medications.  Relevant Imaging Results:  Relevant Lab Results:   Additional Information SSN  [id number]  Antionette Poles, LCSW

## 2018-03-04 NOTE — Progress Notes (Signed)
Report called to [employer]. Report given to RN on call. Currently awaiting for [facility] transport. No questions or concerns at this time.

## 2018-03-05 ENCOUNTER — Non-Acute Institutional Stay (SKILLED_NURSING_FACILITY): Payer: Medicare Other | Admitting: Internal Medicine

## 2018-03-05 ENCOUNTER — Encounter: Payer: Self-pay | Admitting: Internal Medicine

## 2018-03-05 DIAGNOSIS — I4891 Unspecified atrial fibrillation: Secondary | ICD-10-CM

## 2018-03-05 DIAGNOSIS — J181 Lobar pneumonia, unspecified organism: Secondary | ICD-10-CM | POA: Diagnosis not present

## 2018-03-05 DIAGNOSIS — J441 Chronic obstructive pulmonary disease with (acute) exacerbation: Secondary | ICD-10-CM | POA: Diagnosis not present

## 2018-03-05 DIAGNOSIS — F0281 Dementia in other diseases classified elsewhere with behavioral disturbance: Secondary | ICD-10-CM

## 2018-03-05 DIAGNOSIS — J9601 Acute respiratory failure with hypoxia: Secondary | ICD-10-CM

## 2018-03-05 DIAGNOSIS — F39 Unspecified mood [affective] disorder: Secondary | ICD-10-CM

## 2018-03-05 DIAGNOSIS — E034 Atrophy of thyroid (acquired): Secondary | ICD-10-CM

## 2018-03-05 DIAGNOSIS — E0801 Diabetes mellitus due to underlying condition with hyperosmolarity with coma: Secondary | ICD-10-CM

## 2018-03-05 DIAGNOSIS — J189 Pneumonia, unspecified organism: Secondary | ICD-10-CM

## 2018-03-05 DIAGNOSIS — G300 Alzheimer's disease with early onset: Secondary | ICD-10-CM

## 2018-03-05 NOTE — Progress Notes (Signed)
:  Location:  [facility] Room Number: 104P Place of Service:  SNF (31)  Clarisa Danser D. [name], MD  Patient Care Team: Grace Isaac, MD as PCP - General (Internal Medicine) Center, [facility])  Extended Emergency Contact Information Primary Emergency Contact: Bellot,Cynthia Address: [address]          [place], [place] [address] [place] of [place] Home Phone: [phone] Relation: Spouse Secondary Emergency Contact: Debe Coder Mobile Phone: [phone] Relation: Other     Allergies: Patient has no known allergies.  Chief Complaint  Patient presents with  . New Admit To SNF    Admit to [employer]    HPI: Patient is 67 y.o. male with hypertension, hyperlipidemia, diabetes mellitus type 2, hypothyroidism, dementia, paroxysmal atrial fib, and COPD who presented to [facility] long ED with a dry cough dyspnea, wheezing and O2 saturation of 72% on room air.  Patient denied fever, chills, chest pain, palpitations, nausea/vomiting, diarrhea, bright red blood per rectum, black stool.  Chest x-ray showed bronchitic changes, WBC was 12.6.  Patient was admitted to [facility] long hospital from 8/29-9/3 where he was treated for COPD exacerbation and right lower lobe pneumonia with steroids and Zithromax Rocephin and breathing treatments.  Hospital course was complicated by A. fib with RVR which resolved with beta-blocker.  Patient is admitted to skilled nursing facility for OT/PT.  While at skilled nursing facility patient will be followed for hypothyroidism treated with Synthroid, and to treat with Aricept and mood disorder treated with Risperdal.  Past Medical History:  Diagnosis Date  . Anxiety   . Atrial fibrillation (HCC)   . Bipolar disorder (HCC)   . COPD (chronic obstructive pulmonary disease) (HCC)   . Dementia   . Depression   . Diabetes mellitus   . Diabetes mellitus without complication (HCC)   . High  cholesterol   . Hyperlipidemia   . Hypothyroidism     Past Surgical History:  Procedure Laterality Date  . APPENDECTOMY      Allergies as of 03/05/2018   No Known Allergies     Medication List        Accurate as of 03/05/18  2:06 PM. Always use your most recent med list.          albuterol 108 (90 Base) MCG/ACT inhaler Commonly known as:  PROVENTIL HFA;VENTOLIN HFA Inhale 2 puffs into the lungs every 4 (four) hours as needed for wheezing or shortness of breath.   azithromycin 250 MG tablet Commonly known as:  ZITHROMAX One tab PO daily for 3 more days, zero refills   cefdinir 300 MG capsule Commonly known as:  OMNICEF Take 1 capsule (300 mg total) by mouth 2 (two) times daily for 3 days.   donepezil 10 MG tablet Commonly known as:  ARICEPT Take 10 mg by mouth daily.   insulin aspart 100 UNIT/ML FlexPen Commonly known as:  NOVOLOG Inject 8 Units into the skin 3 (three) times daily with meals.   insulin glargine 100 UNIT/ML injection Commonly known as:  LANTUS Inject 0.4 mLs (40 Units total) into the skin at bedtime. Inject 40 units daily increasing by 4 units every 3 days until blood glucose level is less than 120mg /dL   levothyroxine 25 MCG tablet Commonly known as:  SYNTHROID, LEVOTHROID Take 25 mcg by mouth daily before breakfast.   metFORMIN 500 MG tablet Commonly known as:  GLUCOPHAGE Take 500 mg by mouth 2 (two) times daily with  a meal.   metoprolol tartrate 25 MG tablet Commonly known as:  LOPRESSOR Take 1.5 tablets (37.5 mg total) by mouth 2 (two) times daily.   predniSONE 10 MG tablet Commonly known as:  DELTASONE Take 1 tablet (10 mg total) by mouth daily. Taper dose: 40mg  po daily x 2 days, then 20mg  po daily x 2 days, then 10mg  po daily x 2 days, then 5mg  po daily x 2 days, then stop. Zero refills   risperidone 4 MG tablet Commonly known as:  RISPERDAL Take 2 mg by mouth at bedtime.   sertraline 100 MG tablet Commonly known as:  ZOLOFT Take  200 mg by mouth daily.   simvastatin 80 MG tablet Commonly known as:  ZOCOR Take 80 mg by mouth daily.   tamsulosin 0.4 MG Caps capsule Commonly known as:  FLOMAX Take 0.4 mg by mouth at bedtime.   thiothixene 5 MG capsule Commonly known as:  NAVANE Take 5 mg by mouth at bedtime.   tiotropium 18 MCG inhalation capsule Commonly known as:  SPIRIVA Place 1 capsule (18 mcg total) into inhaler and inhale daily.   warfarin 5 MG tablet Commonly known as:  COUMADIN Take 10 mg by mouth daily at 6 PM.       No orders of the defined types were placed in this encounter.    There is no immunization history on file for this patient.  Social History   Tobacco Use  . Smoking status: Former Games developer  . Smokeless tobacco: Never Used  Substance Use Topics  . Alcohol use: No    Comment: former    Family history is   Family History  Adopted: Yes      Review of Systems  DATA OBTAINED: from patient, nurse GENERAL:  no fevers, fatigue, appetite changes SKIN: No itching,rash EYES: No eye pain, redness, discharge EARS: No earache, tinnitus, change in hearing NOSE: No congestion, drainage or bleeding  MOUTH/THROAT: No mouth or tooth pain, No sore throat RESPIRATORY: No cough, wheezing, SOB CARDIAC: No chest pain, palpitations, lower extremity edema  GI: No abdominal pain, No N/V/D or constipation, No heartburn or reflux  GU: No dysuria, frequency or urgency, or incontinence  MUSCULOSKELETAL: No unrelieved bone/joint pain NEUROLOGIC: No headache, dizziness or focal weakness PSYCHIATRIC: No c/o anxiety or sadness   Vitals:   03/05/18 1233  BP: 113/75  Pulse: 62  Resp: 17  Temp: (!) 97.1 F (36.2 C)  SpO2: 96%    SpO2 Readings from Last 1 Encounters:  03/05/18 96%   Body mass index is 29.68 kg/m.     Physical Exam  GENERAL APPEARANCE: Alert, conversant,  No acute distress.  SKIN: No diaphoresis rash HEAD: Normocephalic, atraumatic  EYES: Conjunctiva/lids clear.  Pupils round, reactive. EOMs intact.  EARS: External exam WNL, canals clear. Hearing grossly normal.  NOSE: No deformity or discharge.  MOUTH/THROAT: Lips w/o lesions  RESPIRATORY: Breathing is even, unlabored. Lung sounds are clear   CARDIOVASCULAR: Heart RRR no murmurs, rubs or gallops. No peripheral edema.   GASTROINTESTINAL: Abdomen is soft, non-tender, not distended w/ normal bowel sounds. GENITOURINARY: Bladder non tender, not distended  MUSCULOSKELETAL: No abnormal joints or musculature NEUROLOGIC:  Cranial nerves 2-12 grossly intact. Moves all extremities  PSYCHIATRIC: Mood and affect appropriate to situation, no behavioral issues  Patient Active Problem List   Diagnosis Date Noted  . COPD exacerbation (HCC) 02/27/2018  . Atrial fibrillation (HCC) 04/05/2013  . Diarrhea 04/05/2013  . Orthostatic hypotension 04/05/2013  . Dehydration 04/05/2013  .  Diabetes (HCC) 04/05/2013      Labs reviewed: Basic Metabolic Panel:    Component Value Date/Time   NA 144 03/04/2018 0418   K 3.6 03/04/2018 0418   CL 110 03/04/2018 0418   CO2 23 03/04/2018 0418   GLUCOSE 134 (H) 03/04/2018 0418   BUN 18 03/04/2018 0418   CREATININE 0.56 (L) 03/04/2018 0418   CALCIUM 8.8 (L) 03/04/2018 0418   PROT 5.6 (L) 03/01/2018 0150   ALBUMIN 2.5 (L) 03/01/2018 0150   AST 13 (L) 03/01/2018 0150   ALT 16 03/01/2018 0150   ALKPHOS 58 03/01/2018 0150   BILITOT 0.6 03/01/2018 0150   GFRNONAA >60 03/04/2018 0418   GFRAA >60 03/04/2018 0418    Recent Labs    03/01/18 0150 03/02/18 0516 03/04/18 0418  NA 136 141 144  K 4.5 4.4 3.6  CL 106 111 110  CO2 20* 20* 23  GLUCOSE 410* 271* 134*  BUN 28* 22 18  CREATININE 0.70 0.59* 0.56*  CALCIUM 8.4* 8.5* 8.8*   Liver Function Tests: Recent Labs    02/28/18 0435 03/01/18 0150  AST 16 13*  ALT 18 16  ALKPHOS 70 58  BILITOT 1.0 0.6  PROT 6.3* 5.6*  ALBUMIN 2.7* 2.5*   No results for input(s): LIPASE, AMYLASE in the last 8760 hours. No  results for input(s): AMMONIA in the last 8760 hours. CBC: Recent Labs    02/27/18 1923 02/28/18 0435 03/01/18 0150 03/04/18 0418  WBC 12.6* 12.4* 10.4 9.1  NEUTROABS 10.6*  --   --   --   HGB 12.2* 12.1* 10.8* 12.9*  HCT 35.8* 36.0* 32.4* 38.7*  MCV 89.7 90.9 92.6 91.3  PLT 165 153 165 189   Lipid No results for input(s): CHOL, HDL, LDLCALC, TRIG in the last 8760 hours.  Cardiac Enzymes: Recent Labs    02/28/18 0731 02/28/18 1331 03/01/18 0150  TROPONINI <0.03 <0.03 <0.03   BNP: Recent Labs    02/27/18 1923  BNP 157.7*   No results found for: [facility] Lab Results  Component Value Date   HGBA1C 10.8 (H) 02/28/2018   Lab Results  Component Value Date   TSH 1.242 02/28/2018   No results found for: VITAMINB12 No results found for: FOLATE No results found for: IRON, TIBC, FERRITIN  Imaging and Procedures obtained prior to SNF admission: Dg Chest 2 View  Result Date: 02/27/2018 CLINICAL DATA:  New onset weakness and cough. EXAM: CHEST - 2 VIEW COMPARISON:  05/01/2011 FINDINGS: Stable cardiomegaly with mild aortic atherosclerosis. No acute pulmonary consolidation or CHF. Chronic mild bronchitic change of the lungs with increased interstitial lung markings and peribronchial thickening. No effusion or pneumothorax. Osteoarthritis of the [facility] and glenohumeral joints. Degenerative changes are present along the dorsal spine without significant change. IMPRESSION: Chronic bronchitic change of the lungs. Electronically Signed   By: Tollie Eth M.D.   On: 02/27/2018 20:02   Ct Angio Chest Pe W Or Wo Contrast  Result Date: 02/28/2018 CLINICAL DATA:  67 year old male with shortness of breath and chest pain. Elevated D-dimer. EXAM: CT ANGIOGRAPHY CHEST WITH CONTRAST TECHNIQUE: Multidetector CT imaging of the chest was performed using the standard protocol during bolus administration of intravenous contrast. Multiplanar CT image reconstructions and MIPs were obtained to evaluate the  vascular anatomy. CONTRAST:  ISOVUE-370 IOPAMIDOL (ISOVUE-370) INJECTION 76% COMPARISON:  Chest radiograph dated 02/27/2018 FINDINGS: Cardiovascular: Borderline cardiomegaly. There is a 3 vessel coronary vascular calcification. Mild atherosclerotic calcification of the thoracic aorta. No aneurysmal dilatation or  dissection. Evaluation of the pulmonary arteries is somewhat limited due to respiratory motion artifact and suboptimal visualization of the distal and peripheral branches. No large or central pulmonary artery embolus identified. Mediastinum/Nodes: No hilar or mediastinal adenopathy. Esophagus is grossly unremarkable. No mediastinal fluid collection. Lungs/Pleura: There is a large area of consolidation in the right lower lobe most consistent with pneumonia. Clinical correlation and follow-up to resolution is recommended to exclude underlying mass. There is no pleural effusion or pneumothorax. There is occlusion of the right lower lobe bronchus. 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 Upper Abdomen: Fatty liver with morphologic changes of cirrhosis. There is a 4 cm rim calcified stone in the gallbladder. There is splenomegaly. The spleen is only partially visualized. Musculoskeletal: No chest wall abnormality. No acute or significant osseous findings. Review of the MIP images confirms the above findings. IMPRESSION: 1. No CT evidence of central pulmonary artery embolus. 2. Large area of consolidation involving the right lower lobe most consistent with pneumonia. Clinical correlation and follow-up to resolution recommended. There is occlusion of the right lower lobe bronchus which may be related to mucous impaction although an endobronchial lesion is not entirely excluded. 3. Mild cardiomegaly and coronary vascular calcification. 4. Cirrhosis, cholelithiasis, and splenomegaly. Electronically Signed   By: Elgie Collard M.D.   On: 02/28/2018 06:13     Not all labs, radiology exams or other studies done during  hospitalization come through on my EPIC note; however they are reviewed by me.    Assessment and Plan  Acute respiratory failure with hypoxia/COPD exacerbation/right lower lobe pneumonia-patient was started on Solu-Medrol 80 mg IV q. 8 with Zithromax and Rocephin and continued on scheduled bronchodilators and O2; patient progressed and was able to be weaned to room air and steroids were weaned to prednisone and IV antibiotics to p.o. Antibiotics SNF -continue Zithromax 250 mg p.o. daily for 3 more days, Omnicef 300 mg twice daily for 3 more days prednisone taper and Spiriva 1 inhalation daily  A. fib with RVR-CTA chest negative for PE, TSH within normal limits, improved with beta-blocker metoprolol 25 mg twice daily and Coumadin SNF -continue metoprolol 25 mg tablet 1/2 tablets twice daily for rate and warfarin 5 mg daily  Diabetes mellitus type II, poorly controlled in the setting of steroid use with A1c of 10.8 noted; continued metformin and home Lantus and scheduled sliding scale insulin insulin SNF -continue metformin 500 mg twice daily Lantus insulin 40 units at bedtime and NovoLog insulin 8 units 3 times daily with meals  Dementia SNF -appears controlled; continue Aricept 5 mg daily  Mood disorder SNF -appears controlled; continue Risperdal 2 mg nightly and Navane 5 mg nightly  Hypothyroidism SNF -not stated as uncontrolled; continue Synthroid 25 mcg daily    Spent greater than 45 minutes;> 50% of time with patient was spent reviewing records, labs, tests and studies, counseling and developing plan of care  Thurston Hole D. [name], MD

## 2018-03-13 ENCOUNTER — Encounter: Payer: Self-pay | Admitting: Internal Medicine

## 2018-03-13 DIAGNOSIS — J189 Pneumonia, unspecified organism: Secondary | ICD-10-CM | POA: Insufficient documentation

## 2018-03-13 DIAGNOSIS — J9601 Acute respiratory failure with hypoxia: Secondary | ICD-10-CM | POA: Insufficient documentation

## 2018-03-13 DIAGNOSIS — F39 Unspecified mood [affective] disorder: Secondary | ICD-10-CM | POA: Insufficient documentation

## 2018-03-13 DIAGNOSIS — F03918 Unspecified dementia, unspecified severity, with other behavioral disturbance: Secondary | ICD-10-CM | POA: Insufficient documentation

## 2018-03-13 DIAGNOSIS — E039 Hypothyroidism, unspecified: Secondary | ICD-10-CM | POA: Insufficient documentation

## 2018-03-13 DIAGNOSIS — F0391 Unspecified dementia with behavioral disturbance: Secondary | ICD-10-CM | POA: Insufficient documentation

## 2018-03-17 ENCOUNTER — Encounter: Payer: Self-pay | Admitting: Internal Medicine

## 2018-03-17 ENCOUNTER — Non-Acute Institutional Stay (SKILLED_NURSING_FACILITY): Payer: Medicare Other | Admitting: Internal Medicine

## 2018-03-17 DIAGNOSIS — F0281 Dementia in other diseases classified elsewhere with behavioral disturbance: Secondary | ICD-10-CM

## 2018-03-17 DIAGNOSIS — E0801 Diabetes mellitus due to underlying condition with hyperosmolarity with coma: Secondary | ICD-10-CM

## 2018-03-17 DIAGNOSIS — G300 Alzheimer's disease with early onset: Secondary | ICD-10-CM

## 2018-03-17 DIAGNOSIS — J189 Pneumonia, unspecified organism: Secondary | ICD-10-CM

## 2018-03-17 DIAGNOSIS — J9601 Acute respiratory failure with hypoxia: Secondary | ICD-10-CM | POA: Diagnosis not present

## 2018-03-17 DIAGNOSIS — J181 Lobar pneumonia, unspecified organism: Secondary | ICD-10-CM | POA: Diagnosis not present

## 2018-03-17 DIAGNOSIS — J441 Chronic obstructive pulmonary disease with (acute) exacerbation: Secondary | ICD-10-CM | POA: Diagnosis not present

## 2018-03-17 DIAGNOSIS — I4891 Unspecified atrial fibrillation: Secondary | ICD-10-CM

## 2018-03-17 DIAGNOSIS — E034 Atrophy of thyroid (acquired): Secondary | ICD-10-CM

## 2018-03-17 DIAGNOSIS — F39 Unspecified mood [affective] disorder: Secondary | ICD-10-CM

## 2018-03-17 NOTE — Progress Notes (Signed)
Location:  [facility] Room Number: 104P Place of Service:  SNF (31)  [name]. Eduardo Hollingshead, MD  Patient Care Team: Eduardo Isaac, MD as PCP - General (Internal Medicine) Center, [facility])  Extended Emergency Contact Information Primary Emergency Contact: [name] Address: [address]          [place], [place] [address] [place] of [place] Home Phone: [phone] Relation: Spouse Secondary Emergency Contact: [name] Mobile Phone: [phone] Relation: Other  No Known Allergies  Chief Complaint  Patient presents with  . Discharge Note    Discharge from [facility]    HPI:  67 y.o. male with hypertension, hyperlipidemia, diabetes type 2, hypothyroidism, dementia, paroxysmal atrial fib, and COPD who presented to [facility], ED with dry cough, dyspnea, wheezing, and O2 saturation of 72% on room air.  Patient was admitted to [facility] long hospital from 8/29-9/3 where he was treated for COPD exacerbation and right lower lobe pneumonia with steroids and breathing treatments and Zithromax and Rocephin.  Hospital course was complicated by atrial fibrillar RVR which resolved with a beta-blocker.  Patient was admitted to skilled nursing facility for OT/PT and is now ready to be discharged home.    Past Medical History:  Diagnosis Date  . Anxiety   . Atrial fibrillation (HCC)   . Bipolar disorder (HCC)   . COPD (chronic obstructive pulmonary disease) (HCC)   . Dementia   . Depression   . Diabetes mellitus   . Diabetes mellitus without complication (HCC)   . High cholesterol   . Hyperlipidemia   . Hypothyroidism     Past Surgical History:  Procedure Laterality Date  . APPENDECTOMY       reports that he has quit smoking. He has never used smokeless tobacco. He reports that he does not drink alcohol or use drugs. Social History   Socioeconomic History  . Marital status: Married    Spouse  name: Not on file  . Number of children: Not on file  . Years of education: Not on file  . Highest education level: Not on file  Occupational History  . Not on file  Social Needs  . Financial resource strain: Not on file  . Food insecurity:    Worry: Not on file    Inability: Not on file  . Transportation needs:    Medical: Not on file    Non-medical: Not on file  Tobacco Use  . Smoking status: Former Games developer  . Smokeless tobacco: Never Used  Substance and Sexual Activity  . Alcohol use: No    Comment: former  . Drug use: No  . Sexual activity: Never  Lifestyle  . Physical activity:    Days per week: Not on file    Minutes per session: Not on file  . Stress: Not on file  Relationships  . Social connections:    Talks on phone: Not on file    Gets together: Not on file    Attends religious service: Not on file    Active member of club or organization: Not on file    Attends meetings of clubs or organizations: Not on file    Relationship status: Not on file  . Intimate partner violence:    Fear of current or ex partner: Not on file    Emotionally abused: Not on file    Physically abused: Not on file    Forced sexual activity: Not on file  Other  Topics Concern  . Not on file  Social History Narrative   ** Merged History Encounter **        Pertinent  Health Maintenance Due  Topic Date Due  . FOOT EXAM  01/29/1961  . OPHTHALMOLOGY EXAM  01/29/1961  . URINE MICROALBUMIN  01/29/1961  . COLONOSCOPY  01/29/2001  . PNA vac Low Risk Adult (1 of 2 - PCV13) 01/30/2016  . INFLUENZA VACCINE  01/30/2018  . HEMOGLOBIN A1C  08/30/2018    Medications: Allergies as of 03/17/2018   No Known Allergies     Medication List        Accurate as of 03/17/18 11:59 PM. Always use your most recent med list.          donepezil 10 MG tablet Commonly known as:  ARICEPT Take 10 mg by mouth daily.   insulin aspart 100 UNIT/ML FlexPen Commonly known as:  NOVOLOG Inject 8 Units  into the skin 3 (three) times daily with meals.   insulin glargine 100 UNIT/ML injection Commonly known as:  LANTUS Inject 0.4 mLs (40 Units total) into the skin at bedtime. Inject 40 units daily increasing by 4 units every 3 days until blood glucose level is less than 120mg /dL   levothyroxine 25 MCG tablet Commonly known as:  SYNTHROID, LEVOTHROID Take 25 mcg by mouth daily before breakfast.   metFORMIN 500 MG tablet Commonly known as:  GLUCOPHAGE Take 500 mg by mouth 2 (two) times daily with a meal.   metoprolol tartrate 25 MG tablet Commonly known as:  LOPRESSOR Take 1.5 tablets (37.5 mg total) by mouth 2 (two) times daily.   risperidone 4 MG tablet Commonly known as:  RISPERDAL Take 2 mg by mouth at bedtime.   sertraline 100 MG tablet Commonly known as:  ZOLOFT Take 200 mg by mouth daily.   simvastatin 80 MG tablet Commonly known as:  ZOCOR Take 80 mg by mouth daily.   tamsulosin 0.4 MG Caps capsule Commonly known as:  FLOMAX Take 0.4 mg by mouth at bedtime.   thiothixene 5 MG capsule Commonly known as:  NAVANE Take 5 mg by mouth at bedtime.   tiotropium 18 MCG inhalation capsule Commonly known as:  SPIRIVA Place 1 capsule (18 mcg total) into inhaler and inhale daily.   warfarin 5 MG tablet Commonly known as:  COUMADIN Take 5 mg by mouth daily at 6 PM.        Vitals:   03/17/18 1029  BP: 113/76  Pulse: 77  Resp: 19  Temp: (!) 97.2 F (36.2 C)  Weight: 212 lb (96.2 kg)  Height: 5\' 11"  (1.803 m)   Body mass index is 29.57 kg/m.  Physical Exam  GENERAL APPEARANCE: Alert, conversant. No acute distress.  HEENT: Unremarkable. RESPIRATORY: Breathing is even, unlabored. Lung sounds are clear   CARDIOVASCULAR: Heart RRR no murmurs, rubs or gallops. No peripheral edema.  GASTROINTESTINAL: Abdomen is soft, non-tender, not distended w/ normal bowel sounds.  NEUROLOGIC: Cranial nerves 2-12 grossly intact. Moves all extremities   Labs reviewed: Basic  Metabolic Panel: Recent Labs    03/01/18 0150 03/02/18 0516 03/04/18 0418  NA 136 141 144  K 4.5 4.4 3.6  CL 106 111 110  CO2 20* 20* 23  GLUCOSE 410* 271* 134*  BUN 28* 22 18  CREATININE 0.70 0.59* 0.56*  CALCIUM 8.4* 8.5* 8.8*   No results found for: [facility]MICROALBUR Liver Function Tests: Recent Labs    02/28/18 0435 03/01/18 0150  AST 16 13*  ALT 18 16  ALKPHOS 70 58  BILITOT 1.0 0.6  PROT 6.3* 5.6*  ALBUMIN 2.7* 2.5*   No results for input(s): LIPASE, AMYLASE in the last 8760 hours. No results for input(s): AMMONIA in the last 8760 hours. CBC: Recent Labs    02/27/18 1923 02/28/18 0435 03/01/18 0150 03/04/18 0418  WBC 12.6* 12.4* 10.4 9.1  NEUTROABS 10.6*  --   --   --   HGB 12.2* 12.1* 10.8* 12.9*  HCT 35.8* 36.0* 32.4* 38.7*  MCV 89.7 90.9 92.6 91.3  PLT 165 153 165 189   Lipid No results for input(s): CHOL, HDL, LDLCALC, TRIG in the last 8760 hours. Cardiac Enzymes: Recent Labs    02/28/18 0731 02/28/18 1331 03/01/18 0150  TROPONINI <0.03 <0.03 <0.03   BNP: Recent Labs    02/27/18 1923  BNP 157.7*   CBG: Recent Labs    03/04/18 1117 03/04/18 1709 03/04/18 2051  GLUCAP 206* 275* 205*    Procedures and Imaging Studies During Stay: Dg Chest 2 View  Result Date: 02/27/2018 CLINICAL DATA:  New onset weakness and cough. EXAM: CHEST - 2 VIEW COMPARISON:  05/01/2011 FINDINGS: Stable cardiomegaly with mild aortic atherosclerosis. No acute pulmonary consolidation or CHF. Chronic mild bronchitic change of the lungs with increased interstitial lung markings and peribronchial thickening. No effusion or pneumothorax. Osteoarthritis of the [facility] and glenohumeral joints. Degenerative changes are present along the dorsal spine without significant change. IMPRESSION: Chronic bronchitic change of the lungs. Electronically Signed   By: Tollie Eth M.D.   On: 02/27/2018 20:02   Ct Angio Chest Pe W Or Wo Contrast  Result Date: 02/28/2018 CLINICAL DATA:  67 year old  male with shortness of breath and chest pain. Elevated D-dimer. EXAM: CT ANGIOGRAPHY CHEST WITH CONTRAST TECHNIQUE: Multidetector CT imaging of the chest was performed using the standard protocol during bolus administration of intravenous contrast. Multiplanar CT image reconstructions and MIPs were obtained to evaluate the vascular anatomy. CONTRAST:  ISOVUE-370 IOPAMIDOL (ISOVUE-370) INJECTION 76% COMPARISON:  Chest radiograph dated 02/27/2018 FINDINGS: Cardiovascular: Borderline cardiomegaly. There is a 3 vessel coronary vascular calcification. Mild atherosclerotic calcification of the thoracic aorta. No aneurysmal dilatation or dissection. Evaluation of the pulmonary arteries is somewhat limited due to respiratory motion artifact and suboptimal visualization of the distal and peripheral branches. No large or central pulmonary artery embolus identified. Mediastinum/Nodes: No hilar or mediastinal adenopathy. Esophagus is grossly unremarkable. No mediastinal fluid collection. Lungs/Pleura: There is a large area of consolidation in the right lower lobe most consistent with pneumonia. Clinical correlation and follow-up to resolution is recommended to exclude underlying mass. There is no pleural effusion or pneumothorax. There is occlusion of the right lower lobe bronchus. 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 Upper Abdomen: Fatty liver with morphologic changes of cirrhosis. There is a 4 cm rim calcified stone in the gallbladder. There is splenomegaly. The spleen is only partially visualized. Musculoskeletal: No chest wall abnormality. No acute or significant osseous findings. Review of the MIP images confirms the above findings. IMPRESSION: 1. No CT evidence of central pulmonary artery embolus. 2. Large area of consolidation involving the right lower lobe most consistent with pneumonia. Clinical correlation and follow-up to resolution recommended. There is occlusion of the right lower lobe bronchus which may be related to  mucous impaction although an endobronchial lesion is not entirely excluded. 3. Mild cardiomegaly and coronary vascular calcification. 4. Cirrhosis, cholelithiasis, and splenomegaly. Electronically Signed   By: Elgie Collard M.D.   On: 02/28/2018 06:13  Assessment/Plan:   COPD exacerbation (HCC)  Acute respiratory failure with hypoxia (HCC)  Community acquired pneumonia of right lower lobe of lung (HCC)  Atrial fibrillation with RVR (HCC)  Diabetes mellitus due to underlying condition with hyperosmolarity and coma, without long-term current use of insulin (HCC)  Early onset Alzheimer's disease with behavioral disturbance  Mood disorder (HCC)  Hypothyroidism due to acquired atrophy of thyroid   Patient is being discharged with the following home health services: OT/PT  Patient is being discharged with the following durable medical equipment: Rolling walker while assisting doing mass  Patient has been advised to f/u with their PCP in 1-2 weeks to bring them up to date on their rehab stay.  Social services at facility was responsible for arranging this appointment.  Pt was provided with a 30 day supply of prescriptions for medications and refills must be obtained from their PCP.  For controlled substances, a more limited supply may be provided adequate until PCP appointment only.  Medications have been reconciled.  Spent greater than 30 minutes;> 50% of time with patient was spent reviewing records, labs, tests and studies, counseling and developing plan of care  Margit Hanks MD

## 2020-01-09 IMAGING — CT CT ANGIO CHEST
1 of 6 series · 18 of 36 positions shown · IV contrast (iopamidol)
Comparison: Chest radiograph dated 02/27/2018

CLINICAL DATA: 67-year-old male with shortness of breath and chest
pain. Elevated D-dimer.

EXAM:
CT ANGIOGRAPHY CHEST WITH CONTRAST
TECHNIQUE: Multidetector CT imaging of the chest was performed using the
standard protocol during bolus administration of intravenous
contrast. Multiplanar CT image reconstructions and MIPs were
obtained to evaluate the vascular anatomy.
CONTRAST:  100mL [id number] IOPAMIDOL ([id number]) INJECTION 76%

[Series 5: thins · axial · 0.90mm/px · z∈[+304,+598]mm · 18 of 328 slices shown]
[im 17/328  lung]
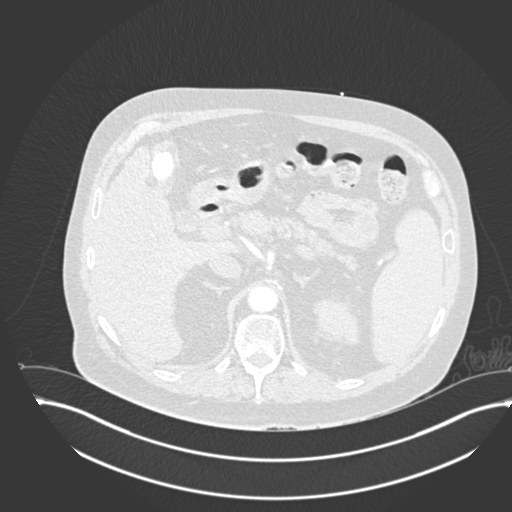
[im 33/328  mediastinal]
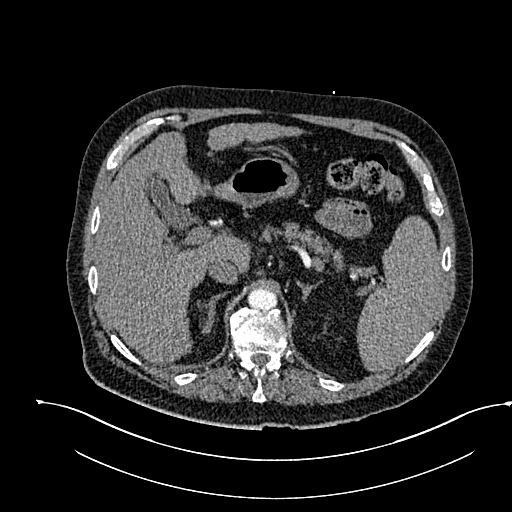
[im 50/328  lung]
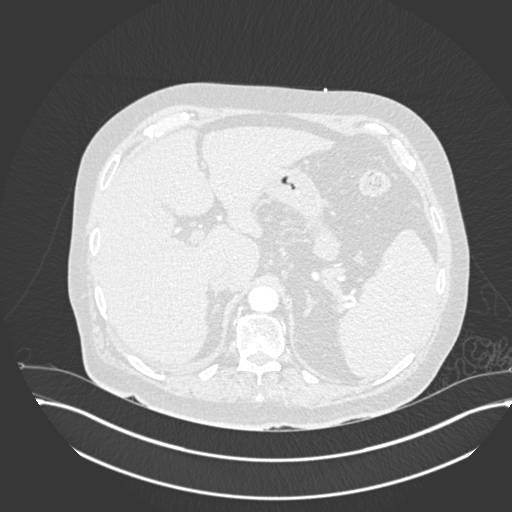
[im 66/328  mediastinal]
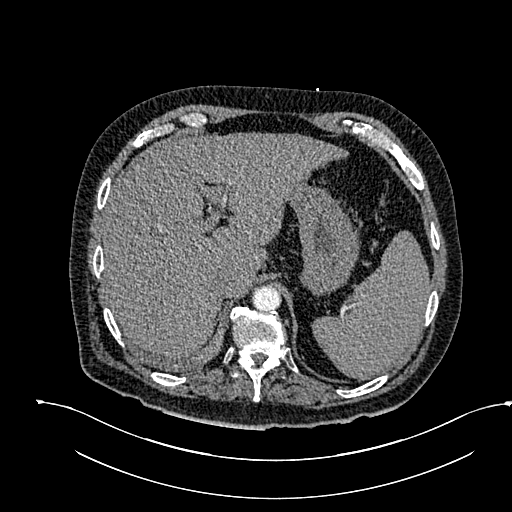
[im 82/328  lung]
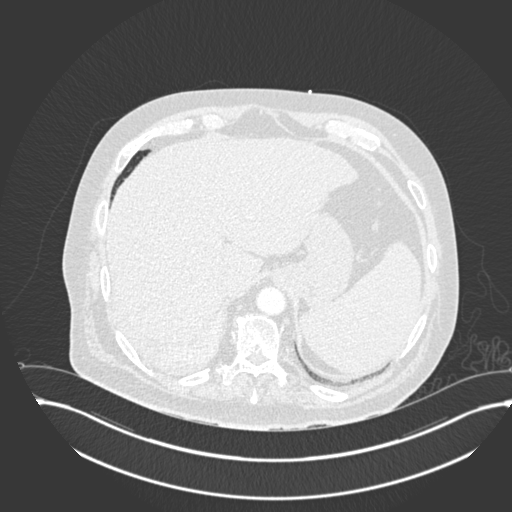
[im 99/328  mediastinal]
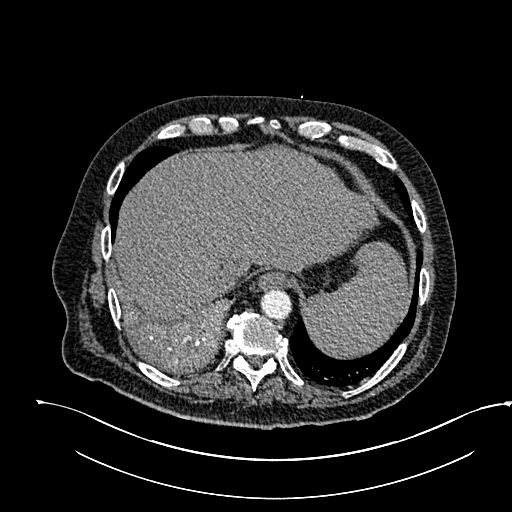
[im 115/328  lung]
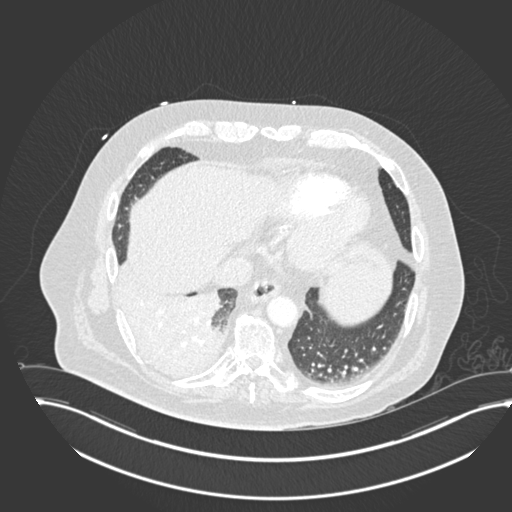
[im 131/328  mediastinal]
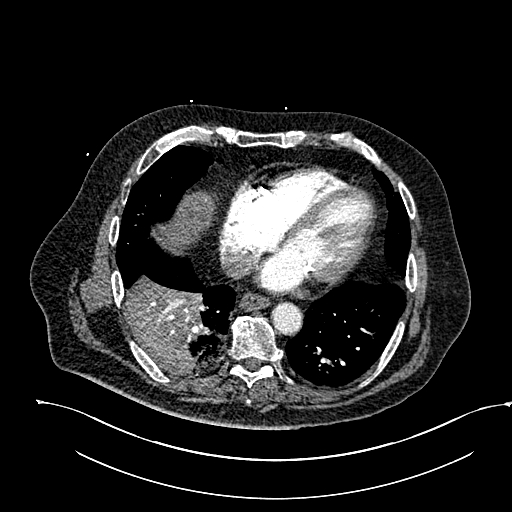
[im 148/328  lung]
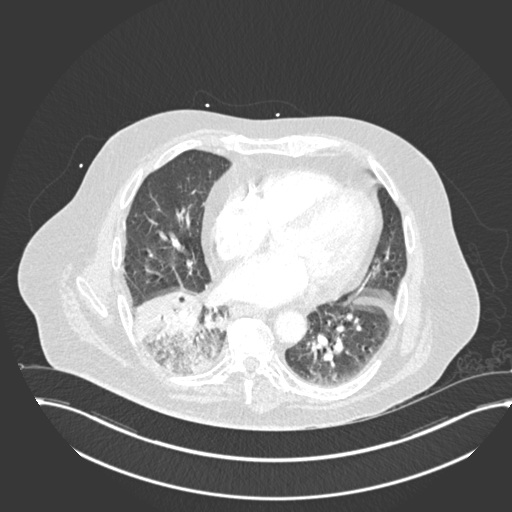
[im 180/328  mediastinal]
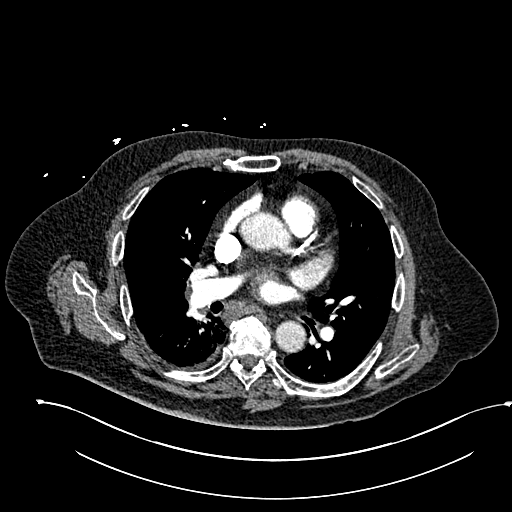
[im 197/328  lung]
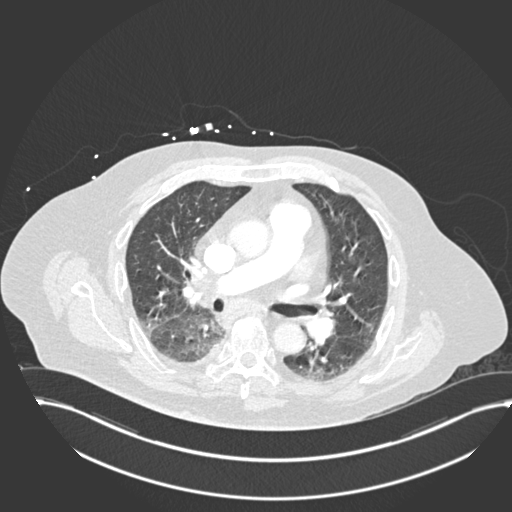
[im 213/328  mediastinal]
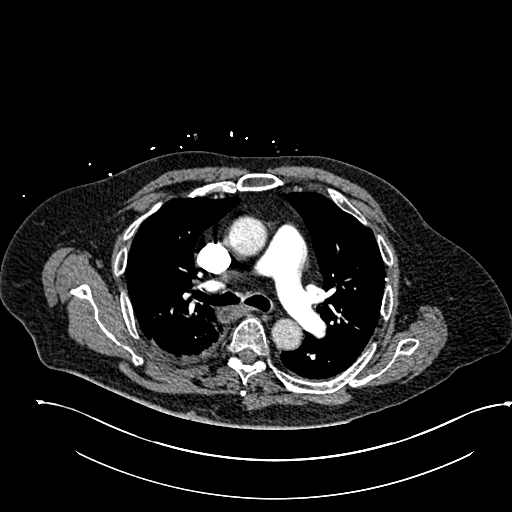
[im 229/328  lung]
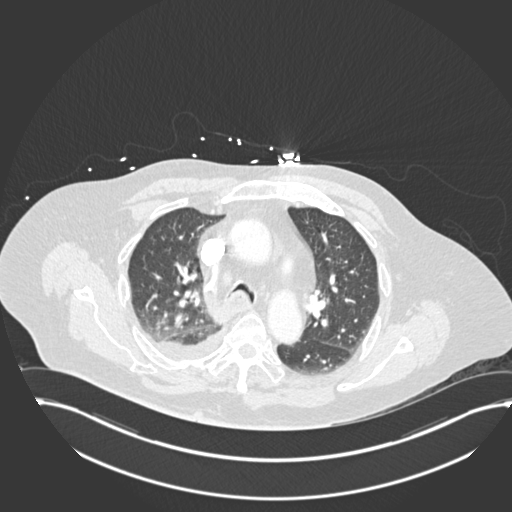
[im 246/328  mediastinal]
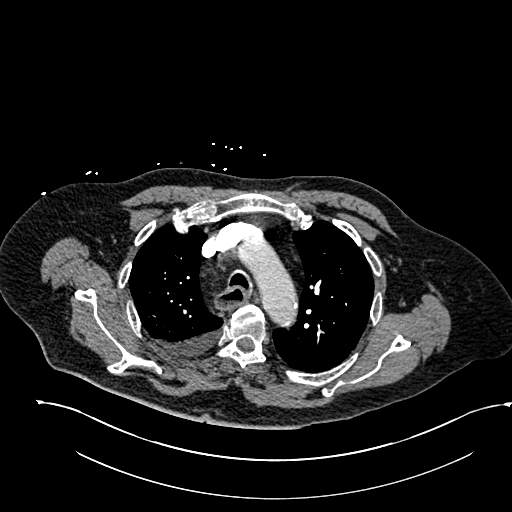
[im 262/328  lung]
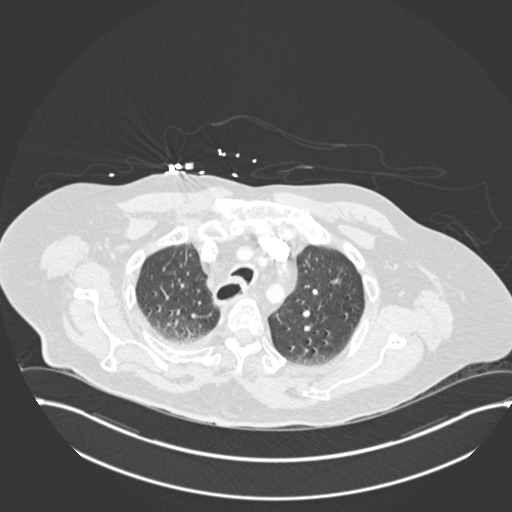
[im 278/328  mediastinal]
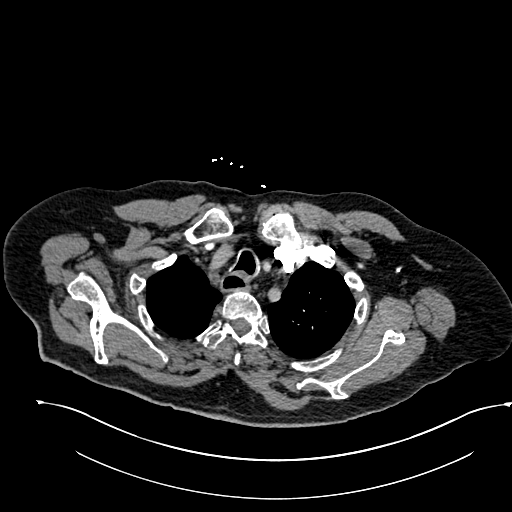
[im 295/328  lung]
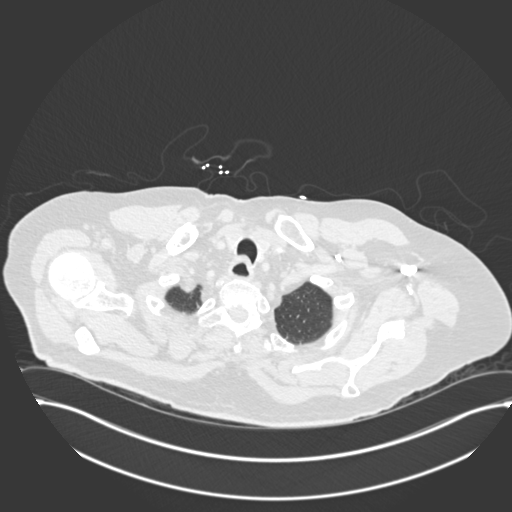
[im 311/328  mediastinal]
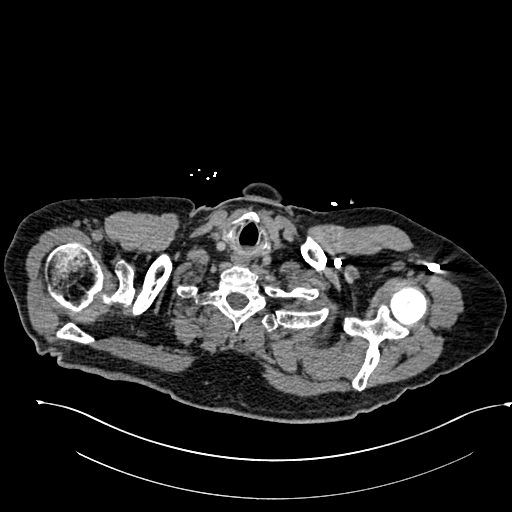

[18 of 36 positions shown; findings below may reference images not displayed]

FINDINGS: Cardiovascular: Borderline cardiomegaly. There is a 3 vessel
coronary vascular calcification. Mild atherosclerotic calcification
of the thoracic aorta. No aneurysmal dilatation or dissection.
Evaluation of the pulmonary arteries is somewhat limited due to
respiratory motion artifact and suboptimal visualization of the
distal and peripheral branches. No large or central pulmonary artery
embolus identified.

Mediastinum/Nodes: No hilar or mediastinal adenopathy. Esophagus is
grossly unremarkable. No mediastinal fluid collection.

Lungs/Pleura: There is a large area of consolidation in the right
lower lobe most consistent with pneumonia. Clinical correlation and
follow-up to resolution is recommended to exclude underlying mass.
There is no pleural effusion or pneumothorax. There is occlusion of
the right lower lobe bronchus. There is tracheal malacia.

Upper Abdomen: Fatty liver with morphologic changes of cirrhosis.
There is a 4 cm rim calcified stone in the gallbladder. There is
splenomegaly. The spleen is only partially visualized.

Musculoskeletal: No chest wall abnormality. No acute or significant
osseous findings.

Review of the MIP images confirms the above findings.
IMPRESSION: 1. No CT evidence of central pulmonary artery embolus.
2. Large area of consolidation involving the right lower lobe most
consistent with pneumonia. Clinical correlation and follow-up to
resolution recommended. There is occlusion of the right lower lobe
bronchus which may be related to mucous impaction although an
endobronchial lesion is not entirely excluded.
3. Mild cardiomegaly and coronary vascular calcification.
4. Cirrhosis, cholelithiasis, and splenomegaly.
